# Patient Record
Sex: Male | Born: 1940
Health system: Southern US, Community
[De-identification: ages and names within clinical notes are randomized; demographics above are authoritative.]

## PROBLEM LIST (undated history)

## (undated) DIAGNOSIS — R7303 Prediabetes: Secondary | ICD-10-CM

## (undated) DIAGNOSIS — G2581 Restless legs syndrome: Secondary | ICD-10-CM

## (undated) DIAGNOSIS — E785 Hyperlipidemia, unspecified: Secondary | ICD-10-CM

## (undated) DIAGNOSIS — N529 Male erectile dysfunction, unspecified: Secondary | ICD-10-CM

## (undated) DIAGNOSIS — D72819 Decreased white blood cell count, unspecified: Secondary | ICD-10-CM

## (undated) DIAGNOSIS — M4310 Spondylolisthesis, site unspecified: Secondary | ICD-10-CM

## (undated) DIAGNOSIS — I2510 Atherosclerotic heart disease of native coronary artery without angina pectoris: Secondary | ICD-10-CM

## (undated) DIAGNOSIS — D509 Iron deficiency anemia, unspecified: Secondary | ICD-10-CM

## (undated) DIAGNOSIS — C61 Malignant neoplasm of prostate: Secondary | ICD-10-CM

## (undated) DIAGNOSIS — K219 Gastro-esophageal reflux disease without esophagitis: Secondary | ICD-10-CM

## (undated) DIAGNOSIS — E291 Testicular hypofunction: Secondary | ICD-10-CM

## (undated) DIAGNOSIS — J309 Allergic rhinitis, unspecified: Secondary | ICD-10-CM

## (undated) DIAGNOSIS — K760 Fatty (change of) liver, not elsewhere classified: Secondary | ICD-10-CM

## (undated) DIAGNOSIS — N400 Enlarged prostate without lower urinary tract symptoms: Secondary | ICD-10-CM

## (undated) DIAGNOSIS — I10 Essential (primary) hypertension: Secondary | ICD-10-CM

## (undated) DIAGNOSIS — D751 Secondary polycythemia: Secondary | ICD-10-CM

## (undated) DIAGNOSIS — M1990 Unspecified osteoarthritis, unspecified site: Secondary | ICD-10-CM

## (undated) DIAGNOSIS — R918 Other nonspecific abnormal finding of lung field: Secondary | ICD-10-CM

## (undated) DIAGNOSIS — J439 Emphysema, unspecified: Secondary | ICD-10-CM

## (undated) DIAGNOSIS — Z87442 Personal history of urinary calculi: Secondary | ICD-10-CM

## (undated) DIAGNOSIS — I700 Atherosclerosis of aorta: Secondary | ICD-10-CM

## (undated) DIAGNOSIS — M459 Ankylosing spondylitis of unspecified sites in spine: Secondary | ICD-10-CM

## (undated) HISTORY — DX: Male erectile dysfunction, unspecified: N52.9

## (undated) HISTORY — DX: Gastro-esophageal reflux disease without esophagitis: K21.9

## (undated) HISTORY — DX: Hyperlipidemia, unspecified: E78.5

## (undated) HISTORY — DX: Essential (primary) hypertension: I10

## (undated) HISTORY — PX: ROTATOR CUFF REPAIR: SHX139

---

## 2000-02-16 ENCOUNTER — Emergency Department (HOSPITAL_COMMUNITY): Admission: EM | Admit: 2000-02-16 | Discharge: 2000-02-16 | Payer: Self-pay | Admitting: *Deleted

## 2003-10-17 ENCOUNTER — Ambulatory Visit (HOSPITAL_COMMUNITY): Admission: RE | Admit: 2003-10-17 | Discharge: 2003-10-17 | Payer: Self-pay | Admitting: Internal Medicine

## 2003-12-28 ENCOUNTER — Ambulatory Visit (HOSPITAL_COMMUNITY): Admission: RE | Admit: 2003-12-28 | Discharge: 2003-12-28 | Payer: Self-pay | Admitting: Gastroenterology

## 2003-12-28 ENCOUNTER — Encounter (INDEPENDENT_AMBULATORY_CARE_PROVIDER_SITE_OTHER): Payer: Self-pay | Admitting: *Deleted

## 2004-01-31 ENCOUNTER — Ambulatory Visit (HOSPITAL_COMMUNITY): Admission: RE | Admit: 2004-01-31 | Discharge: 2004-01-31 | Payer: Self-pay | Admitting: Internal Medicine

## 2004-02-17 ENCOUNTER — Ambulatory Visit (HOSPITAL_COMMUNITY): Admission: RE | Admit: 2004-02-17 | Discharge: 2004-02-17 | Payer: Self-pay | Admitting: Internal Medicine

## 2005-03-28 ENCOUNTER — Ambulatory Visit: Payer: Self-pay | Admitting: Family Medicine

## 2005-04-02 ENCOUNTER — Ambulatory Visit: Payer: Self-pay | Admitting: Family Medicine

## 2005-04-25 ENCOUNTER — Ambulatory Visit: Payer: Self-pay | Admitting: Family Medicine

## 2005-04-30 ENCOUNTER — Ambulatory Visit: Payer: Self-pay | Admitting: Family Medicine

## 2005-06-04 ENCOUNTER — Ambulatory Visit: Payer: Self-pay | Admitting: Family Medicine

## 2005-06-18 ENCOUNTER — Ambulatory Visit: Payer: Self-pay | Admitting: Family Medicine

## 2005-07-22 ENCOUNTER — Ambulatory Visit: Payer: Self-pay | Admitting: Family Medicine

## 2006-02-12 ENCOUNTER — Ambulatory Visit: Payer: Self-pay | Admitting: Family Medicine

## 2006-04-01 ENCOUNTER — Encounter: Admission: RE | Admit: 2006-04-01 | Discharge: 2006-04-01 | Payer: Self-pay | Admitting: Family Medicine

## 2006-04-01 ENCOUNTER — Ambulatory Visit: Payer: Self-pay | Admitting: Family Medicine

## 2007-04-10 DIAGNOSIS — J309 Allergic rhinitis, unspecified: Secondary | ICD-10-CM | POA: Insufficient documentation

## 2007-04-10 DIAGNOSIS — K219 Gastro-esophageal reflux disease without esophagitis: Secondary | ICD-10-CM | POA: Insufficient documentation

## 2007-05-05 ENCOUNTER — Ambulatory Visit: Payer: Self-pay | Admitting: Family Medicine

## 2007-05-05 LAB — CONVERTED CEMR LAB
ALT: 15 units/L (ref 0–53)
AST: 17 units/L (ref 0–37)
Bilirubin Urine: NEGATIVE
Bilirubin, Direct: 0.1 mg/dL (ref 0.0–0.3)
Calcium: 9.3 mg/dL (ref 8.4–10.5)
Direct LDL: 137.2 mg/dL
Eosinophils Relative: 3.2 % (ref 0.0–5.0)
GFR calc Af Amer: 109 mL/min
GFR calc non Af Amer: 90 mL/min
Glucose, Urine, Semiquant: NEGATIVE
HCT: 47.9 % (ref 39.0–52.0)
HDL: 40.8 mg/dL (ref >39.0)
Hemoglobin: 16.7 g/dL (ref 13.0–17.0)
Ketones, urine, test strip: NEGATIVE
MCV: 88.3 fL (ref 78.0–100.0)
Monocytes Absolute: 0.4 10*3/uL (ref 0.2–0.7)
Monocytes Relative: 8.8 % (ref 3.0–11.0)
Nitrite: NEGATIVE
Potassium: 5.6 meq/L — ABNORMAL HIGH (ref 3.5–5.1)
Protein, U semiquant: NEGATIVE
RBC: 5.43 M/uL (ref 4.22–5.81)
Sodium: 142 meq/L (ref 135–145)
Total Bilirubin: 0.7 mg/dL (ref 0.3–1.2)
VLDL: 27 mg/dL (ref 0–40)
WBC Urine, dipstick: NEGATIVE
pH: 6.5

## 2007-05-14 ENCOUNTER — Ambulatory Visit: Payer: Self-pay | Admitting: Family Medicine

## 2007-05-14 DIAGNOSIS — M459 Ankylosing spondylitis of unspecified sites in spine: Secondary | ICD-10-CM | POA: Insufficient documentation

## 2007-05-14 DIAGNOSIS — F528 Other sexual dysfunction not due to a substance or known physiological condition: Secondary | ICD-10-CM | POA: Insufficient documentation

## 2007-05-15 ENCOUNTER — Encounter: Admission: RE | Admit: 2007-05-15 | Discharge: 2007-05-15 | Payer: Self-pay | Admitting: Family Medicine

## 2007-05-19 ENCOUNTER — Telehealth: Payer: Self-pay | Admitting: Family Medicine

## 2008-04-28 ENCOUNTER — Encounter: Admission: RE | Admit: 2008-04-28 | Discharge: 2008-04-28 | Payer: Self-pay | Admitting: Orthopedic Surgery

## 2008-05-25 ENCOUNTER — Ambulatory Visit: Payer: Self-pay | Admitting: Family Medicine

## 2008-05-25 LAB — CONVERTED CEMR LAB
Basophils Relative: 0.8 % (ref 0.0–3.0)
Calcium: 9.2 mg/dL (ref 8.4–10.5)
Chloride: 106 meq/L (ref 96–112)
Cholesterol: 238 mg/dL (ref 0–200)
Creatinine, Ser: 0.9 mg/dL (ref 0.4–1.5)
Eosinophils Absolute: 0.2 10*3/uL (ref 0.0–0.7)
GFR calc non Af Amer: 90 mL/min
Glucose, Bld: 97 mg/dL (ref 70–99)
Glucose, Urine, Semiquant: NEGATIVE
HCT: 50.2 % (ref 39.0–52.0)
Hemoglobin: 17.1 g/dL — ABNORMAL HIGH (ref 13.0–17.0)
MCHC: 34 g/dL (ref 30.0–36.0)
Monocytes Relative: 9.1 % (ref 3.0–12.0)
Neutro Abs: 2.9 10*3/uL (ref 1.4–7.7)
RDW: 13.4 % (ref 11.5–14.6)
Sodium: 142 meq/L (ref 135–145)
Specific Gravity, Urine: 1.02
Total Bilirubin: 1 mg/dL (ref 0.3–1.2)
Total CHOL/HDL Ratio: 5
Total Protein: 7 g/dL (ref 6.0–8.3)
Urobilinogen, UA: 0.2
VLDL: 39 mg/dL (ref 0–40)
WBC Urine, dipstick: NEGATIVE
WBC: 5 10*3/uL (ref 4.5–10.5)

## 2008-06-09 ENCOUNTER — Ambulatory Visit: Payer: Self-pay | Admitting: Family Medicine

## 2008-06-09 DIAGNOSIS — I10 Essential (primary) hypertension: Secondary | ICD-10-CM | POA: Insufficient documentation

## 2008-06-09 DIAGNOSIS — M1990 Unspecified osteoarthritis, unspecified site: Secondary | ICD-10-CM | POA: Insufficient documentation

## 2008-08-12 ENCOUNTER — Telehealth: Payer: Self-pay | Admitting: Family Medicine

## 2008-08-17 ENCOUNTER — Telehealth: Payer: Self-pay | Admitting: *Deleted

## 2009-02-08 ENCOUNTER — Telehealth: Payer: Self-pay | Admitting: Family Medicine

## 2009-03-27 ENCOUNTER — Ambulatory Visit: Payer: Self-pay | Admitting: Family Medicine

## 2009-03-27 LAB — CONVERTED CEMR LAB
AST: 27 units/L (ref 0–37)
BUN: 15 mg/dL (ref 6–23)
Bilirubin, Direct: 0.1 mg/dL (ref 0.0–0.3)
Calcium: 9.2 mg/dL (ref 8.4–10.5)
Chloride: 108 meq/L (ref 96–112)
Creatinine, Ser: 1 mg/dL (ref 0.4–1.5)
Eosinophils Relative: 2 % (ref 0.0–5.0)
Glucose, Bld: 100 mg/dL — ABNORMAL HIGH (ref 70–99)
HCT: 45.7 % (ref 39.0–52.0)
HDL: 42.7 mg/dL (ref >39.00)
LDL Cholesterol: 113 mg/dL — ABNORMAL HIGH (ref 0–99)
MCHC: 33.4 g/dL (ref 30.0–36.0)
Monocytes Relative: 6.6 % (ref 3.0–12.0)
Neutro Abs: 2.7 10*3/uL (ref 1.4–7.7)
Neutrophils Relative %: 65.9 % (ref 43.0–77.0)
PSA: 1.92 ng/mL (ref 0.10–4.00)
Total CHOL/HDL Ratio: 4
VLDL: 16.6 mg/dL (ref 0.0–40.0)
WBC: 4.1 10*3/uL — ABNORMAL LOW (ref 4.5–10.5)

## 2009-04-04 ENCOUNTER — Ambulatory Visit: Payer: Self-pay | Admitting: Family Medicine

## 2009-05-22 ENCOUNTER — Telehealth: Payer: Self-pay | Admitting: Family Medicine

## 2009-11-08 ENCOUNTER — Telehealth: Payer: Self-pay | Admitting: Family Medicine

## 2010-03-05 ENCOUNTER — Ambulatory Visit: Payer: Self-pay | Admitting: Family Medicine

## 2010-03-05 LAB — CONVERTED CEMR LAB
ALT: 18 units/L (ref 0–53)
Basophils Relative: 0.6 % (ref 0.0–3.0)
Bilirubin, Direct: 0.1 mg/dL (ref 0.0–0.3)
Blood in Urine, dipstick: NEGATIVE
CO2: 28 meq/L (ref 19–32)
Chloride: 104 meq/L (ref 96–112)
Cholesterol: 218 mg/dL — ABNORMAL HIGH (ref 0–200)
Direct LDL: 157.4 mg/dL
Eosinophils Absolute: 0.1 10*3/uL (ref 0.0–0.7)
Glucose, Bld: 133 mg/dL — ABNORMAL HIGH (ref 70–99)
Hemoglobin: 15.8 g/dL (ref 13.0–17.0)
MCHC: 33.4 g/dL (ref 30.0–36.0)
MCV: 87.2 fL (ref 78.0–100.0)
Monocytes Absolute: 0.5 10*3/uL (ref 0.1–1.0)
Nitrite: NEGATIVE
PSA: 2.16 ng/mL (ref 0.10–4.00)
Platelets: 166 10*3/uL (ref 150.0–400.0)
Protein, U semiquant: NEGATIVE
RDW: 13.7 % (ref 11.5–14.6)
Specific Gravity, Urine: 1.025
TSH: 1 microintl units/mL (ref 0.35–5.50)
Triglycerides: 162 mg/dL — ABNORMAL HIGH (ref 0.0–149.0)
VLDL: 32.4 mg/dL (ref 0.0–40.0)
WBC: 5.6 10*3/uL (ref 4.5–10.5)
pH: 6

## 2010-03-13 ENCOUNTER — Ambulatory Visit: Payer: Self-pay | Admitting: Family Medicine

## 2010-03-15 ENCOUNTER — Telehealth: Payer: Self-pay | Admitting: Family Medicine

## 2010-03-16 ENCOUNTER — Telehealth: Payer: Self-pay | Admitting: Family Medicine

## 2010-04-03 ENCOUNTER — Ambulatory Visit: Payer: Self-pay | Admitting: Family Medicine

## 2010-06-06 ENCOUNTER — Telehealth: Payer: Self-pay | Admitting: Family Medicine

## 2010-07-29 ENCOUNTER — Encounter: Payer: Self-pay | Admitting: Internal Medicine

## 2010-07-30 ENCOUNTER — Encounter: Payer: Self-pay | Admitting: Orthopedic Surgery

## 2010-08-07 NOTE — Assessment & Plan Note (Signed)
Summary: CPX // RS   Vital Signs:  Patient profile:   70 year old male Height:      70.5 inches Weight:      226 pounds BMI:     32.08 Temp:     98.1 degrees F oral BP sitting:   130 / 90  (left arm) Cuff size:   regular  Vitals Entered By: Kathrynn Speed CMA (March 13, 2010 1:56 PM) CC: cpx, review labs, src Is Patient Diabetic? No   CC:  cpx, review labs, and src.  History of Present Illness: Terry Rogers is a 70 year old, married male, retired ex-smoker, who comes in today for evaluation of osteoarthritis, hypertension, erectile dysfunction, reflux esophagitis, allergic rhinitis hyperlipidemia.  For his osteoarthritis.  He takes indomethacin 75 mg b.i.d..  For his hypertension.  He takes atenolol 25 mg daily.  BP 130/90.  He takes omeprazole 20 mg daily for reflux esophagitis.  Viagra 100 mg p.r.n. for ED, Nasacort, and Astelin nasal spray for allergic rhinitis.  His insurance will no longer cover Astelin.  Advised to switch to Zyrtec 10 mg nightly.  He also takes Zetia 10 mg daily for hyperlipidemia.  Being an ex-smoker.  He concerned about lung cancer.  We discussed the fact that there are no really good screening test.  I offered experimental protocol with screening CT scan versus routine chest x-ray.  Patient elects for screening.  Chest x-ray.  Tetanus 2006, Pneumovax 2008, seasonal flu today.  Information given on shingles Flu Vaccine Consent Questions     Do you have a history of severe allergic reactions to this vaccine? no    Any prior history of allergic reactions to egg and/or gelatin? no    Do you have a sensitivity to the preservative Thimersol? no    Do you have a past history of Guillan-Barre Syndrome? no    Do you currently have an acute febrile illness? no    Have you ever had a severe reaction to latex? no    Vaccine information given and explained to patient? yes    Are you currently pregnant? no    Lot Number:[id number]   Exp Date:01/05/2011   Site Given   Left Deltoid IM   Preventive Screening-Counseling & Management  Alcohol-Tobacco     Smoking Status: quit     Year Quit: 07     Passive Smoke Exposure: no  Current Medications (verified): 1)  Indocin Sr 75 Mg  Cpcr (Indomethacin) .... Two Times A Day 2)  Atenolol 25 Mg  Tabs (Atenolol) .... Once Daily 3)  Omeprazole 20 Mg  Cpdr (Omeprazole) .... Once Daily 4)  Viagra 100 Mg  Tabs (Sildenafil Citrate) .... As Needed 5)  Nasacort Aq 55 Mcg/act  Aers (Triamcinolone Acetonide(Nasal)) .... As Needed 6)  Astelin 137 Mcg/spray  Soln (Azelastine Hcl) .... Once Daily 7)  Zetia 10 Mg Tabs (Ezetimibe) .... Take One Tab By Mouth Once Daily  Allergies (verified): 1)  ! Sulfa  Past History:  Past medical, surgical, family and social histories (including risk factors) reviewed, and no changes noted (except as noted below).  Past Medical History: Reviewed history from 06/09/2008 and no changes required. ED Allergic rhinitis GERD Hypertension Osteoarthritis  Past Surgical History: Reviewed history from 06/09/2008 and no changes required. Colonoscopy Tonsillectomy Rotator cuff repair right and left shoulders 09  Family History: Reviewed history from 04/10/2007 and no changes required. Family History Breast cancer 1st degree relative <50 Family History of Neurological disorder  Social History: Reviewed  history from 06/09/2008 and no changes required. Occupation: Married  Alcohol use-yes Former Smoker Alcohol use-no Drug use-no  Review of Systems      See HPI  Physical Exam  General:  Well-developed,well-nourished,in no acute distress; alert,appropriate and cooperative throughout examination Head:  Normocephalic and atraumatic without obvious abnormalities. No apparent alopecia or balding. Eyes:  No corneal or conjunctival inflammation noted. EOMI. Perrla. Funduscopic exam benign, without hemorrhages, exudates or papilledema. Vision grossly normal. Ears:  External ear exam  shows no significant lesions or deformities.  Otoscopic examination reveals clear canals, tympanic membranes are intact bilaterally without bulging, retraction, inflammation or discharge. Hearing is grossly normal bilaterally. Nose:  External nasal examination shows no deformity or inflammation. Nasal mucosa are pink and moist without lesions or exudates. Mouth:  Oral mucosa and oropharynx without lesions or exudates.  Teeth in good repair. Neck:  No deformities, masses, or tenderness noted. Chest Wall:  No deformities, masses, tenderness or gynecomastia noted. Breasts:  No masses or gynecomastia noted Lungs:  Normal respiratory effort, chest expands symmetrically. Lungs are clear to auscultation, no crackles or wheezes. Heart:  Normal rate and regular rhythm. S1 and S2 normal without gallop, murmur, click, rub or other extra sounds. Abdomen:  Bowel sounds positive,abdomen soft and non-tender without masses, organomegaly or hernias noted. Rectal:  No external abnormalities noted. Normal sphincter tone. No rectal masses or tenderness. Genitalia:  Testes bilaterally descended without nodularity, tenderness or masses. No scrotal masses or lesions. No penis lesions or urethral discharge. Prostate:  Prostate gland firm and smooth, no enlargement, nodularity, tenderness, mass, asymmetry or induration. Msk:  No deformity or scoliosis noted of thoracic or lumbar spine.   Pulses:  R and L carotid,radial,femoral,dorsalis pedis and posterior tibial pulses are full and equal bilaterally Extremities:  No clubbing, cyanosis, edema, or deformity noted with normal full range of motion of all joints.   Neurologic:  No cranial nerve deficits noted. Station and gait are normal. Plantar reflexes are down-going bilaterally. DTRs are symmetrical throughout. Sensory, motor and coordinative functions appear intact. Skin:  Intact without suspicious lesions or rashes Cervical Nodes:  No lymphadenopathy noted Axillary Nodes:   No palpable lymphadenopathy Inguinal Nodes:  No significant adenopathy Psych:  Cognition and judgment appear intact. Alert and cooperative with normal attention span and concentration. No apparent delusions, illusions, hallucinations   Impression & Recommendations:  Problem # 1:  OSTEOARTHRITIS (ICD-715.90) Assessment Unchanged  His updated medication list for this problem includes:    Indocin Sr 75 Mg Cpcr (Indomethacin) .Marland Kitchen..Marland Kitchen Two times a day  Orders: Prescription Created Electronically [phone])  Problem # 2:  HYPERTENSION (ICD-401.9) Assessment: Unchanged  His updated medication list for this problem includes:    Atenolol 25 Mg Tabs (Atenolol) ..... Once daily  Orders: EKG w/ Interpretation (93000) T-2 View CXR (71020TC) Prescription Created Electronically [phone])  Problem # 3:  ERECTILE DYSFUNCTION, MILD (ICD-302.72) Assessment: Unchanged  His updated medication list for this problem includes:    Viagra 100 Mg Tabs (Sildenafil citrate) .Marland Kitchen... As needed  Orders: Prescription Created Electronically [phone])  Problem # 4:  PHYSICAL EXAMINATION (ICD-V70.0) Assessment: Unchanged  Orders: EKG w/ Interpretation (93000) Prescription Created Electronically [phone])  Problem # 5:  GERD (ICD-530.81) Assessment: Improved  His updated medication list for this problem includes:    Omeprazole 20 Mg Cpdr (Omeprazole) ..... Once daily  Orders: Prescription Created Electronically [id number])  Problem # 6:  ALLERGIC RHINITIS (ICD-477.9) Assessment: Improved  The following medications were removed from the medication list:  Astelin 137 Mcg/spray Soln (Azelastine hcl) ..... Once daily His updated medication list for this problem includes:    Nasacort Aq 55 Mcg/act Aers (Triamcinolone acetonide(nasal)) .Marland Kitchen... As needed  Orders: Prescription Created Electronically [id number])  Complete Medication List: 1)  Indocin Sr 75 Mg Cpcr (Indomethacin) .... Two times a day 2)  Atenolol 25 Mg  Tabs (Atenolol) .... Once daily 3)  Omeprazole 20 Mg Cpdr (Omeprazole) .... Once daily 4)  Viagra 100 Mg Tabs (Sildenafil citrate) .... As needed 5)  Nasacort Aq 55 Mcg/act Aers (Triamcinolone acetonide(nasal)) .... As needed 6)  Zetia 10 Mg Tabs (Ezetimibe) .... Take one tab by mouth once daily  Other Orders: Admin 1st Vaccine (60454) Flu Vaccine 26yrs + [phone])  Patient Instructions: 1)  continue the steroid nasal spray, however, I would add 10 mg a plain Zyrtec at bedtime in lieu of the antihistamine  nasal spray 2)  We will call you or the report on your chest x-ray. 3)  Consider the information on the shingles.  Vaccine 4)  Please schedule a follow-up appointment in 1 year. 5)  Please schedule a follow-up appointment as needed. Prescriptions: ZETIA 10 MG TABS (EZETIMIBE) take one tab by mouth once daily  #100 x 3   Entered and Authorized by:   Roderick Pee MD   Signed by:   Roderick Pee MD on 03/13/2010   Method used:   Electronically to        [facility]* (retail)             ,          Ph: [phone]       Fax: [phone]   RxID:   [id number] NASACORT AQ 55 MCG/ACT  AERS (TRIAMCINOLONE ACETONIDE(NASAL)) as needed  #3 x 3   Entered and Authorized by:   Roderick Pee MD   Signed by:   Roderick Pee MD on 03/13/2010   Method used:   Electronically to        [facility]* (retail)             ,          Ph: [phone]       Fax: [phone]   RxID:   [id number] VIAGRA 100 MG  TABS (SILDENAFIL CITRATE) as needed  #6 x 11   Entered and Authorized by:   Roderick Pee MD   Signed by:   Roderick Pee MD on 03/13/2010   Method used:   Electronically to        [facility]* (retail)             ,          Ph: [phone]       Fax: [phone]   RxID:   [id number] OMEPRAZOLE 20 MG  CPDR (OMEPRAZOLE) once daily  #100 Capsule x 3   Entered and Authorized by:   Roderick Pee MD   Signed by:   Roderick Pee MD on 03/13/2010   Method  used:   Electronically to        [facility]* (retail)             ,          Ph: [phone]       Fax: [phone]   RxID:   [id number] ATENOLOL 25 MG  TABS (ATENOLOL) once daily  #100 x 3   Entered and Authorized by:   Roderick Pee  MD   Signed by:   Roderick Pee MD on 03/13/2010   Method used:   Electronically to        [facility]* (retail)             ,          Ph: [phone]       Fax: [phone]   RxID:   [id number] INDOCIN SR 75 MG  CPCR (INDOMETHACIN) two times a day  #200 x 3   Entered and Authorized by:   Roderick Pee MD   Signed by:   Roderick Pee MD on 03/13/2010   Method used:   Electronically to        [facility]* (retail)             ,          Ph: [phone]       Fax: [phone]   RxID:   [id number]

## 2010-08-07 NOTE — Progress Notes (Signed)
Summary: take out wife's # & NS to [facility]  Phone Note Call from Patient Call back at Home Phone [phone]   Caller: vm Call For: rachel Summary of Call: 1)Take wife's number, [id number]. out of my records. 2) NS message was called to wife.  She didn't completely get it.  Want you to call it to [facility].  Please let me know.  Not sure if you've done that.   Initial call taken by: Rudy Jew, RN,  March 16, 2010 11:09 AM  Follow-up for Phone Call        spoke with patient  Follow-up by: Kern Reap CMA Duncan Dull),  March 16, 2010 11:26 AM

## 2010-08-07 NOTE — Progress Notes (Signed)
Summary: please return call  Phone Note Call from Patient Call back at Home Phone [phone]   Caller: Patient--live call Summary of Call: received letter from [facility]. they want him to have a pneumo vax .is this ok for him to get? Initial call taken by: Warnell Forester,  June 06, 2010 11:02 AM  Follow-up for Phone Call        Phone Call Completed Follow-up by: Kern Reap CMA Duncan Dull),  June 06, 2010 1:07 PM

## 2010-08-07 NOTE — Assessment & Plan Note (Signed)
Summary: SHINGLES INJ/NJR  Nurse Visit   Allergies: 1)  ! Sulfa  Immunizations Administered:  Zostavax # 1:    Vaccine Type: Zostavax    Site: left deltoid    Mfr: Merck    Dose: 0.65    Route: [place]    Given by: Kern Reap CMA (AAMA)    Exp. Date: 02/23/2011    Lot #: [id number]    VIS given: 04/19/05 given April 03, 2010.    Physician counseled: yes  Orders Added: 1)  Zoster (Shingles) Vaccine Live [90736] 2)  Admin 1st Vaccine [phone]

## 2010-08-07 NOTE — Progress Notes (Signed)
Summary: refills  Phone Note Refill Request Message from:  Fax from Pharmacy on Nov 08, 2009 1:34 PM  Refills Requested: Medication #1:  INDOCIN SR 75 MG  CPCR two times a day  Medication #2:  OMEPRAZOLE 20 MG  CPDR once daily Initial call taken by: Kern Reap CMA Duncan Dull),  Nov 08, 2009 1:34 PM    Prescriptions: OMEPRAZOLE 20 MG  CPDR (OMEPRAZOLE) once daily  #100 x 0   Entered by:   Kern Reap CMA (AAMA)   Authorized by:   Roderick Pee MD   Signed by:   Kern Reap CMA (AAMA) on 11/08/2009   Method used:   Electronically to        [facility]* (mail-order)             ,          Ph: [phone]       Fax: [phone]   RxID:   [id number] INDOCIN SR 75 MG  CPCR (INDOMETHACIN) two times a day  #200 x 0   Entered by:   Kern Reap CMA (AAMA)   Authorized by:   Roderick Pee MD   Signed by:   Kern Reap CMA (AAMA) on 11/08/2009   Method used:   Electronically to        [facility]* (mail-order)             ,          Ph: [phone]       Fax: [phone]   RxID:   [id number]

## 2010-08-07 NOTE — Progress Notes (Signed)
Summary: rx  Phone Note Call from Patient Call back at Work Phone [phone]   Summary of Call: patient is calling because nasocort is no longer available and would like the rx switched. Initial call taken by: Kern Reap CMA Duncan Dull),  March 15, 2010 1:53 PM    New/Updated Medications: FLONASE 50 MCG/ACT SUSP (FLUTICASONE PROPIONATE) use as directed Prescriptions: FLONASE 50 MCG/ACT SUSP (FLUTICASONE PROPIONATE) use as directed  #3 x 4   Entered by:   Kern Reap CMA (AAMA)   Authorized by:   Roderick Pee MD   Signed by:   Kern Reap CMA (AAMA) on 03/15/2010   Method used:   Electronically to        [facility]* (retail)             ,          Ph: [phone]       Fax: [phone]   RxID:   [id number]

## 2010-08-21 ENCOUNTER — Ambulatory Visit (INDEPENDENT_AMBULATORY_CARE_PROVIDER_SITE_OTHER): Payer: Medicare Other | Admitting: Family Medicine

## 2010-08-21 ENCOUNTER — Encounter: Payer: Self-pay | Admitting: Family Medicine

## 2010-08-21 VITALS — BP 160/100 | Temp 98.4°F | Ht 71.5 in | Wt 238.0 lb

## 2010-08-21 DIAGNOSIS — R197 Diarrhea, unspecified: Secondary | ICD-10-CM

## 2010-08-21 DIAGNOSIS — F528 Other sexual dysfunction not due to a substance or known physiological condition: Secondary | ICD-10-CM

## 2010-08-21 DIAGNOSIS — I10 Essential (primary) hypertension: Secondary | ICD-10-CM

## 2010-08-21 MED ORDER — SILDENAFIL CITRATE 100 MG PO TABS: 100.0000 mg | ORAL_TABLET | ORAL | Status: DC | PRN

## 2010-08-21 NOTE — Patient Instructions (Signed)
Ago on a complete to lactose free diet for one week if after that point in time is symptoms do not abate and call GI [phone] and make an appointment to see a gastroenterologist.  Check blood pressure daily for two weeks.  If your blood pressure drops to normal,,,,,,,,,, 135/85 or less,,,,,,,,,, then continue your current dose of atenolol.  If not been increased to 50 mg.  Return for y  annual physical exam

## 2010-08-21 NOTE — Progress Notes (Signed)
  Subjective:    Patient ID: Terry Rogers, male    DOB: [birth year], 70 y.o.   MRN: [id number]  HPI Terry Rogers is a 70 year old male, who comes in today for evaluation of diarrhea x 8 weeks.  He states around Christmas time.  He was in [place] and developed diarrhea.  He had a evaluation, including a stool culture, all of which was negative.  However, the diarrhea persists.  He describes it as 3 loose bowel movements a day.  No fever, chills, nausea, vomiting, etc. No weight loss.  No blood.  Recent colonoscopy normal.  Also, his blood pressure is 160/100 on Tenormin 25 daily.  He would also like a refill of 100,,,,,,,,,,,, 100-mg Viagra tablets.  He gets in [place]   Review of Systems    Negative Objective:   Physical Exam    Well-developed well-nourished, male in no acute distress.  Examination of the abdomen was negative    Assessment & Plan:  Loose bowel movements typically not diarrhea at 3 loose bowel movements per day.  Recommend a lactose free diet.  If symptoms persist.  GI consult.  Hypertension.  Question control.  Check your blood pressure daily if it continues to be elevated and then would need to increase her beta-blocker to 50 mg daily.  Erectile dysfunction.  I explained I would be happy to write in 6 tablets with 11 refills, not 100

## 2010-09-07 ENCOUNTER — Telehealth: Payer: Self-pay | Admitting: Family Medicine

## 2010-09-07 DIAGNOSIS — I10 Essential (primary) hypertension: Secondary | ICD-10-CM

## 2010-09-07 MED ORDER — ATENOLOL 50 MG PO TABS: 50.0000 mg | ORAL_TABLET | Freq: Every day | ORAL | Status: DC

## 2010-09-07 NOTE — Telephone Encounter (Signed)
Pt has been ckecking and recording bp reading for 2 wks as requested  By Dr. Tawanna Cooler.  It has been averaging 143/90, does he want to change my medication?

## 2010-09-07 NOTE — Telephone Encounter (Signed)
Increase the beta-blocker to 50 mg daily in the morning.  Check y  blood pressure daily for 4 weeks goal 135/85 or less

## 2010-09-07 NOTE — Telephone Encounter (Signed)
rx sent and patient is aware 

## 2010-09-17 ENCOUNTER — Encounter (INDEPENDENT_AMBULATORY_CARE_PROVIDER_SITE_OTHER): Payer: Self-pay | Admitting: *Deleted

## 2010-09-21 ENCOUNTER — Telehealth: Payer: Self-pay | Admitting: *Deleted

## 2010-09-21 NOTE — Telephone Encounter (Signed)
patient  Is calling because his blood pressure readings are still ranging 130/80.  He is taking atenolol 50 every day.  Any suggestions?  He also says that his blood pressure is running lower in the afternoon.

## 2010-09-24 NOTE — Telephone Encounter (Signed)
Left message on machine for patient

## 2010-09-24 NOTE — Telephone Encounter (Signed)
Fleet Contras please call 130/80, is normal

## 2010-09-25 NOTE — Letter (Signed)
Summary: New Patient letter  [facility]  [address] [place], [place] [address]   Phone: [phone]  Fax: [phone]       09/17/2010 MRN: [id number]  Terry Rogers [address] [place], [place]  [address]  [place]  Dear Mr. Spurlock,  Welcome to the Gastroenterology Division at [facility].    You are scheduled to see Dr.  Leone Payor on 10-02-10 at 1:45P.M. on the 3rd floor at [facility], [address].  We ask that you try to arrive at our office 15 minutes prior to your appointment time to allow for check-in.  We would like you to complete the enclosed self-administered evaluation form prior to your visit and bring it with you on the day of your appointment.  We will review it with you.  Also, please bring a complete list of all your medications or, if you prefer, bring the medication bottles and we will list them.  Please bring your insurance card so that we may make a copy of it.  If your insurance requires a referral to see a specialist, please bring your referral form from your primary care physician.  Co-payments are due at the time of your visit and may be paid by cash, check or credit card.     Your office visit will consist of a consult with your physician (includes a physical exam), any laboratory testing he/she may order, scheduling of any necessary diagnostic testing (e.g. x-ray, ultrasound, CT-scan), and scheduling of a procedure (e.g. Endoscopy, Colonoscopy) if required.  Please allow enough time on your schedule to allow for any/all of these possibilities.    If you cannot keep your appointment, please call [phone] to cancel or reschedule prior to your appointment date.  This allows [place] the opportunity to schedule an appointment for another patient in need of care.  If you do not cancel or reschedule by 5 p.m. the business day prior to your appointment date, you will be charged a $50.00 late cancellation/no-show fee.    Thank you for  choosing [facility] for your medical needs.  We appreciate the opportunity to care for you.  Please visit [place] at our website  to learn more about our practice.                     Sincerely,                                                             The Gastroenterology Division

## 2010-10-02 ENCOUNTER — Ambulatory Visit (INDEPENDENT_AMBULATORY_CARE_PROVIDER_SITE_OTHER): Payer: Medicare Other | Admitting: Internal Medicine

## 2010-10-02 ENCOUNTER — Encounter: Payer: Self-pay | Admitting: Internal Medicine

## 2010-10-02 VITALS — BP 122/84 | HR 80 | Ht 71.0 in | Wt 234.0 lb

## 2010-10-02 DIAGNOSIS — R197 Diarrhea, unspecified: Secondary | ICD-10-CM | POA: Insufficient documentation

## 2010-10-02 MED ORDER — PEG-KCL-NACL-NASULF-NA ASC-C 100 G PO SOLR: 1.0000 | Freq: Once | ORAL | Status: AC

## 2010-10-02 MED ORDER — ALIGN 4 MG PO CAPS: 1.0000 | ORAL_CAPSULE | Freq: Every day | ORAL | Status: AC

## 2010-10-02 NOTE — Assessment & Plan Note (Addendum)
Chronic problem. Started with an acute illness while in [place]. Improved with antibiotics and then relapsed. I can find no risk factors for this at this point though he does have ankylosing spondylitis so that may increase his risk for inflammatory bowel disease. Will plan on a colonoscopy with random biopsies at least and hopeful terminal ileal intubation. Further plans pending the colonoscopy results. In the interim will try Align probiotic. He could have a post-infectious IBS. Risks and benefits of procedure explained. Routine CPX labs in Nov 2011 all normal and reviewed.

## 2010-10-02 NOTE — Progress Notes (Signed)
  Subjective:    Patient ID: Terry Rogers, male    DOB: [birth year], 70 y.o.   MRN: [id number]  HPI Comments: Bad stomach whole life. Wears suspenders to avoid nausea from belt pressure. In December in [place] he had 3 weeks of diarrhea. Saw an MD and stool tests (negative) and received an antibiotic he cannot remember name. Bad diarrhea improved but had persistent loose and frequent stools. It then improved after stopping Imodium. Then in last few weeks he has had "bad diarrhea"  For about a week. No incontinence. No abdominal pain. No fever or chills. No antibiotics prior to December. In general he will get loose stools with excessive alcohol, fatty foods may give trouble also. No unintentional weight loss. No recent medication changes.  Diarrhea  Pertinent negatives include no arthralgias or headaches.      Review of Systems  Constitutional: Negative.   HENT: Negative.   Eyes: Negative.   Respiratory: Negative.   Cardiovascular: Negative.   Gastrointestinal: Positive for diarrhea.  Genitourinary: Negative for dysuria, frequency, flank pain and difficulty urinating.  Musculoskeletal: Negative for back pain, joint swelling and arthralgias.  Neurological: Negative for dizziness, weakness and headaches.  Hematological: Negative for adenopathy.  Psychiatric/Behavioral: Negative for behavioral problems, dysphoric mood and agitation.       Objective:   Physical Exam  Constitutional: He is oriented to person, place, and time. He appears well-developed and well-nourished.  Eyes: Conjunctivae are normal. Pupils are equal, round, and reactive to light.  Neck: Normal range of motion. Neck supple. No thyromegaly present.  Cardiovascular: Normal rate, regular rhythm and normal heart sounds.   No murmur heard. Pulmonary/Chest: Effort normal and breath sounds normal. No respiratory distress.  Abdominal: Soft. Bowel sounds are normal. He exhibits no distension and no mass. There is no  tenderness.  Musculoskeletal: He exhibits no edema.  Lymphadenopathy:    He has no cervical adenopathy.  Neurological: He is oriented to person, place, and time.  Skin: Skin is warm and dry.  Psychiatric: He has a normal mood and affect.           Assessment & Plan:

## 2010-10-02 NOTE — Patient Instructions (Addendum)
Start the Align capsules to see if that helps her diarrhea. We will see you at your colonoscopy as scheduled. If your diarrhea completely resolves on the overlying, call back you may not need the colonoscopy. Colonoscopy has been scheduled for 10/30/10 3:30 pm arrive at 2:30 pm. Your prescription has been sent to your pharmacy for you to pick up.

## 2010-10-08 ENCOUNTER — Other Ambulatory Visit: Payer: Medicare Other | Admitting: Internal Medicine

## 2010-10-29 ENCOUNTER — Encounter: Payer: Self-pay | Admitting: Internal Medicine

## 2010-10-30 ENCOUNTER — Ambulatory Visit (AMBULATORY_SURGERY_CENTER): Payer: Medicare Other | Admitting: Internal Medicine

## 2010-10-30 ENCOUNTER — Encounter: Payer: Self-pay | Admitting: Internal Medicine

## 2010-10-30 VITALS — BP 127/87 | HR 65 | Temp 97.7°F | Resp 20 | Ht 71.0 in | Wt 220.0 lb

## 2010-10-30 DIAGNOSIS — D126 Benign neoplasm of colon, unspecified: Secondary | ICD-10-CM

## 2010-10-30 DIAGNOSIS — R197 Diarrhea, unspecified: Secondary | ICD-10-CM

## 2010-10-30 DIAGNOSIS — K633 Ulcer of intestine: Secondary | ICD-10-CM

## 2010-10-30 DIAGNOSIS — K5190 Ulcerative colitis, unspecified, without complications: Secondary | ICD-10-CM

## 2010-10-30 MED ORDER — SODIUM CHLORIDE 0.9 % IV SOLN: 500.0000 mL | INTRAVENOUS | Status: DC

## 2010-10-30 NOTE — Patient Instructions (Signed)
Our office will call you with pathology results and instructions on follow up care.

## 2010-10-31 ENCOUNTER — Telehealth: Payer: Self-pay

## 2010-10-31 NOTE — Telephone Encounter (Signed)

## 2010-11-07 NOTE — Progress Notes (Signed)
Quick Note:  Call from office: 1) polyp not precancerous 2) ileal ulcers are non-specific inflammation  He was better with align. How is he now re: diarrhea?  [facility] Needs 10 year colonoscopy recall ______

## 2010-11-08 ENCOUNTER — Telehealth: Payer: Self-pay

## 2010-11-08 NOTE — Telephone Encounter (Signed)
Patient reports that he has no diarrhea as long as he is taking Librarian, academic.  He is doing much better.  He will call back for further problems or questions

## 2010-11-08 NOTE — Telephone Encounter (Signed)
Message copied by Darcey Nora on Thu Nov 08, 2010 12:00 PM ------      Message from: Stan Head      Created: Wed Nov 07, 2010 11:23 PM       Call from office:      1) polyp not precancerous      2) ileal ulcers are non-specific inflammation            He was better with align. How is he now re: diarrhea?            [facility]      Needs 10 year colonoscopy recall

## 2010-11-23 NOTE — Op Note (Signed)
Terry Rogers, Terry Rogers                            ACCOUNT NO.:  [id number]   MEDICAL RECORD NO.:  [id number]                   PATIENT TYPE:  AMB   LOCATION:  ENDO                                 FACILITY:  [facility]   PHYSICIAN:  Danise Edge, M.D.                DATE OF BIRTH:  1940-10-20   DATE OF PROCEDURE:  12/28/2003  DATE OF DISCHARGE:                                 OPERATIVE REPORT   PROCEDURE:  Colonoscopy and polypectomy.   PROCEDURE INDICATION:  [name] is a 70 year old male, born 19-Jul-1940.  [name] is scheduled to undergo his first screening  colonoscopy with polypectomy to prevent colon cancer.   ENDOSCOPIST:  Charolett Bumpers, M.D.   PREMEDICATION:  1. Versed 7 mg.  2. Demerol 50 mg.   DESCRIPTION OF PROCEDURE:  After obtaining informed consent, [name] was  placed in the left lateral decubitus position.  I administered intravenous  Demerol and intravenous Versed to achieve conscious sedation for the  procedure.  The patient's blood pressure, oxygen saturation, and cardiac  rhythm were monitored throughout the procedure and documented in the medical  record.   Anal inspection and digital rectal exam were normal.  The Olympus adjustable  pediatric colonoscope was introduced into the rectum and advanced to the  cecum.  Colonic preparation for the exam today was excellent.   RECTUM:  Four 2 mm sessile polyps were removed from the rectum with the hot  biopsy forceps.  SIGMOID COLON AND DESCENDING COLON:  From the sigmoid colon, at 30 cm from  the anal verge, a 2 mm sessile polyp was removed with the hot biopsy  forceps.  At 70 cm from the anal verge, a 2 mm sessile polyp was removed  with the hot biopsy forceps.  SPLENIC FLEXURE:  Normal.  TRANSVERSE COLON:  Normal.  HEPATIC FLEXURE:  Normal.  ASCENDING COLON:  Normal.  CECUM AND ILEOCECAL VALVE:  Normal.   ASSESSMENT:  A small polyp was removed from the descending colon.  A small  polyp was  removed from the sigmoid colon, and 4 small polyps were removed  from the rectum.  All polyps were removed with the hot biopsy forceps and  submitted in 1 bottle for pathological evaluation.                                               Danise Edge, M.D.    MJ/MEDQ  D:  12/28/2003  T:  12/28/2003  Job:  [id number]   cc:   Ike Bene, M.D.  [address]  [place]  [place] [address]  Fax: [phone]

## 2010-12-14 ENCOUNTER — Telehealth: Payer: Self-pay | Admitting: *Deleted

## 2010-12-14 NOTE — Telephone Encounter (Signed)
patient  States that the shingles vaccine given in the office will cost $285, but given at the pharmacy would have been no charge according to his insurance. fyi.

## 2010-12-25 ENCOUNTER — Telehealth: Payer: Self-pay | Admitting: *Deleted

## 2010-12-25 NOTE — Telephone Encounter (Signed)
Pt has poison ivy -request steroids--[address]- pt number is [phone]

## 2010-12-26 MED ORDER — PREDNISONE 20 MG PO TABS: 20.0000 mg | ORAL_TABLET | Freq: Every day | ORAL | Status: AC

## 2010-12-26 NOTE — Telephone Encounter (Signed)
Prednisone 20 mg, dispense 30 tabs directions two tabs x 3 days, one x 3 days, a half x 3 days, then half a tablet Monday, Wednesday, Friday, for a two week taper, refills x 1 

## 2011-02-02 ENCOUNTER — Other Ambulatory Visit: Payer: Self-pay | Admitting: Family Medicine

## 2011-04-19 ENCOUNTER — Other Ambulatory Visit: Payer: Self-pay | Admitting: Family Medicine

## 2011-04-24 ENCOUNTER — Other Ambulatory Visit: Payer: Self-pay | Admitting: *Deleted

## 2011-04-24 MED ORDER — EZETIMIBE 10 MG PO TABS: 10.0000 mg | ORAL_TABLET | Freq: Every day | ORAL | Status: DC

## 2011-04-25 ENCOUNTER — Other Ambulatory Visit (INDEPENDENT_AMBULATORY_CARE_PROVIDER_SITE_OTHER): Payer: Medicare Other

## 2011-04-25 DIAGNOSIS — Z125 Encounter for screening for malignant neoplasm of prostate: Secondary | ICD-10-CM

## 2011-04-25 DIAGNOSIS — I10 Essential (primary) hypertension: Secondary | ICD-10-CM

## 2011-04-25 DIAGNOSIS — Z0000 Encounter for general adult medical examination without abnormal findings: Secondary | ICD-10-CM

## 2011-04-25 DIAGNOSIS — E7800 Pure hypercholesterolemia, unspecified: Secondary | ICD-10-CM

## 2011-04-25 LAB — LIPID PANEL: Cholesterol: 226 mg/dL — ABNORMAL HIGH (ref 0–200)

## 2011-04-25 LAB — CBC WITH DIFFERENTIAL/PLATELET
Basophils Absolute: 0 10*3/uL (ref 0.0–0.1)
Basophils Relative: 0.4 % (ref 0.0–3.0)
HCT: 48 % (ref 39.0–52.0)
Hemoglobin: 15.9 g/dL (ref 13.0–17.0)
Lymphocytes Relative: 22.6 % (ref 12.0–46.0)
Lymphs Abs: 1.2 10*3/uL (ref 0.7–4.0)
MCHC: 33.2 g/dL (ref 30.0–36.0)
Monocytes Relative: 7.6 % (ref 3.0–12.0)
Neutro Abs: 3.4 10*3/uL (ref 1.4–7.7)
RBC: 5.47 Mil/uL (ref 4.22–5.81)
RDW: 14.8 % — ABNORMAL HIGH (ref 11.5–14.6)

## 2011-04-25 LAB — POCT URINALYSIS DIPSTICK
Bilirubin, UA: NEGATIVE
Blood, UA: NEGATIVE
Glucose, UA: NEGATIVE
Ketones, UA: NEGATIVE
Spec Grav, UA: 1.025
Urobilinogen, UA: 0.2

## 2011-04-25 LAB — TSH: TSH: 1.37 u[IU]/mL (ref 0.35–5.50)

## 2011-04-25 LAB — HEPATIC FUNCTION PANEL
ALT: 19 U/L (ref 0–53)
AST: 21 U/L (ref 0–37)
Albumin: 3.7 g/dL (ref 3.5–5.2)
Alkaline Phosphatase: 93 U/L (ref 39–117)
Total Protein: 6.3 g/dL (ref 6.0–8.3)

## 2011-04-25 LAB — PSA: PSA: 2.27 ng/mL (ref 0.10–4.00)

## 2011-04-25 LAB — BASIC METABOLIC PANEL
CO2: 30 mEq/L (ref 19–32)
Calcium: 9.1 mg/dL (ref 8.4–10.5)
GFR: 86.65 mL/min (ref >60.00)
Glucose, Bld: 130 mg/dL — ABNORMAL HIGH (ref 70–99)
Potassium: 5.3 mEq/L — ABNORMAL HIGH (ref 3.5–5.1)
Sodium: 143 mEq/L (ref 135–145)

## 2011-04-25 LAB — LDL CHOLESTEROL, DIRECT: Direct LDL: 146.7 mg/dL

## 2011-04-29 ENCOUNTER — Encounter: Payer: Self-pay | Admitting: Family Medicine

## 2011-04-29 ENCOUNTER — Ambulatory Visit (INDEPENDENT_AMBULATORY_CARE_PROVIDER_SITE_OTHER): Payer: Medicare Other | Admitting: Family Medicine

## 2011-04-29 DIAGNOSIS — K219 Gastro-esophageal reflux disease without esophagitis: Secondary | ICD-10-CM

## 2011-04-29 DIAGNOSIS — J309 Allergic rhinitis, unspecified: Secondary | ICD-10-CM

## 2011-04-29 DIAGNOSIS — E7800 Pure hypercholesterolemia, unspecified: Secondary | ICD-10-CM

## 2011-04-29 DIAGNOSIS — M459 Ankylosing spondylitis of unspecified sites in spine: Secondary | ICD-10-CM

## 2011-04-29 DIAGNOSIS — Z23 Encounter for immunization: Secondary | ICD-10-CM

## 2011-04-29 DIAGNOSIS — I10 Essential (primary) hypertension: Secondary | ICD-10-CM

## 2011-04-29 DIAGNOSIS — E7801 Familial hypercholesterolemia: Secondary | ICD-10-CM

## 2011-04-29 DIAGNOSIS — M1990 Unspecified osteoarthritis, unspecified site: Secondary | ICD-10-CM

## 2011-04-29 DIAGNOSIS — F528 Other sexual dysfunction not due to a substance or known physiological condition: Secondary | ICD-10-CM

## 2011-04-29 MED ORDER — ATENOLOL 50 MG PO TABS: 50.0000 mg | ORAL_TABLET | Freq: Every day | ORAL | Status: DC

## 2011-04-29 MED ORDER — EZETIMIBE 10 MG PO TABS: 10.0000 mg | ORAL_TABLET | Freq: Every day | ORAL | Status: DC

## 2011-04-29 MED ORDER — OMEPRAZOLE 20 MG PO CPDR: 20.0000 mg | DELAYED_RELEASE_CAPSULE | Freq: Every day | ORAL | Status: DC

## 2011-04-29 MED ORDER — SILDENAFIL CITRATE 100 MG PO TABS: 100.0000 mg | ORAL_TABLET | ORAL | Status: DC | PRN

## 2011-04-29 MED ORDER — INDOMETHACIN ER 75 MG PO CPCR: 75.0000 mg | ORAL_CAPSULE | Freq: Two times a day (BID) | ORAL | Status: DC

## 2011-04-29 NOTE — Patient Instructions (Signed)
Continue your good health habits.  Walk 30 minutes a day in [place].  My son-in-law's name is Dr. Jonette Mate     ..........Marland Kitchen     [facility]

## 2011-04-29 NOTE — Progress Notes (Signed)
  Subjective:    Patient ID: Terry Rogers, male    DOB: Apr 20, 1941, 70 y.o.   MRN: [id number]  HPI Terry Rogers is a 69 year old male, nonsmoker, who comes in today for Medicare wellness examination because of a history of hypertension, hyperlipidemia, allergic rhinitis osteoarthritis reflux esophagitis, and erectile dysfunction.  He states that he has a had a good year except his right shoulder dislocated again.  If the third episode.  His orthopedist is Dr. Eulah Pont.  He takes Tenormin 50 mg daily for hypertension.  BP 118/80.  For hyperlipidemia takes Zetia 10 mg daily.  For osteoarthritis, and ankylosing spondylitis.  He takes indomethacin 75 mg b.i.d.  He takes Prilosec 20 daily for reflux.  He also takes Cipro 100 mg p.r.n. For ED.     Review of Systems  Constitutional: Negative.   HENT: Negative.   Eyes: Negative.   Respiratory: Negative.   Cardiovascular: Negative.   Gastrointestinal: Negative.   Genitourinary: Negative.   Musculoskeletal: Negative.   Skin: Negative.   Neurological: Negative.   Hematological: Negative.   Psychiatric/Behavioral: Negative.        Objective:   Physical Exam  Constitutional: He is oriented to person, place, and time. He appears well-developed and well-nourished.  HENT:  Head: Normocephalic and atraumatic.  Right Ear: External ear normal.  Left Ear: External ear normal.  Nose: Nose normal.  Mouth/Throat: Oropharynx is clear and moist.  Eyes: Conjunctivae and EOM are normal. Pupils are equal, round, and reactive to light.  Neck: Normal range of motion. Neck supple. No JVD present. No tracheal deviation present. No thyromegaly present.  Cardiovascular: Normal rate, regular rhythm, normal heart sounds and intact distal pulses.  Exam reveals no gallop and no friction rub.   No murmur heard. Pulmonary/Chest: Effort normal and breath sounds normal. No stridor. No respiratory distress. He has no wheezes. He has no rales. He exhibits no  tenderness.  Abdominal: Soft. Bowel sounds are normal. He exhibits no distension and no mass. There is no tenderness. There is no rebound and no guarding.  Genitourinary: Rectum normal, prostate normal and penis normal. Guaiac negative stool. No penile tenderness.  Musculoskeletal: Normal range of motion. He exhibits no edema and no tenderness.  Lymphadenopathy:    He has no cervical adenopathy.  Neurological: He is alert and oriented to person, place, and time. He has normal reflexes. No cranial nerve deficit. He exhibits normal muscle tone.  Skin: Skin is warm and dry. No rash noted. No erythema. No pallor.  Psychiatric: He has a normal mood and affect. His behavior is normal. Judgment and thought content normal.          Assessment & Plan:  Healthy male.  History of hypertension.  Continue Tenormin, 50 mg daily.  History of hyperlipidemia.  Continue CEA, 10 mg daily.  History of osteoarthritis and ankylosing spondylitis.  Continue indomethacin 75 mg daily.  History of reflux continue Prilosec 20 mg daily.  History of erectile dysfunction continue Viagra p.r.n.  Return one year, sooner if any problem

## 2011-05-02 ENCOUNTER — Ambulatory Visit (HOSPITAL_COMMUNITY): Payer: Medicare Other

## 2011-05-02 ENCOUNTER — Ambulatory Visit (INDEPENDENT_AMBULATORY_CARE_PROVIDER_SITE_OTHER): Payer: Medicare Other

## 2011-05-02 ENCOUNTER — Inpatient Hospital Stay (INDEPENDENT_AMBULATORY_CARE_PROVIDER_SITE_OTHER)
Admission: RE | Admit: 2011-05-02 | Discharge: 2011-05-02 | Disposition: A | Discharge status: Home / Self Care | Payer: Medicare Other | Source: Ambulatory Visit | Attending: Family Medicine | Admitting: Family Medicine

## 2011-05-02 DIAGNOSIS — S43006A Unspecified dislocation of unspecified shoulder joint, initial encounter: Secondary | ICD-10-CM

## 2011-05-06 ENCOUNTER — Other Ambulatory Visit: Payer: Self-pay | Admitting: Orthopedic Surgery

## 2011-05-06 DIAGNOSIS — M25511 Pain in right shoulder: Secondary | ICD-10-CM

## 2011-05-13 ENCOUNTER — Telehealth: Payer: Self-pay | Admitting: Family Medicine

## 2011-05-13 NOTE — Telephone Encounter (Signed)
Transderm-Scop patches........Marland Kitchen Apply 8 hours prior to sailing................ One patch last 3 days

## 2011-05-13 NOTE — Telephone Encounter (Signed)
Pt is going on a cruise and is requesting an RX for a sea sick patch [facility] [address]

## 2011-05-14 ENCOUNTER — Ambulatory Visit
Admission: RE | Admit: 2011-05-14 | Discharge: 2011-05-14 | Disposition: A | Discharge status: Home / Self Care | Payer: Medicare Other | Source: Ambulatory Visit | Attending: Orthopedic Surgery | Admitting: Orthopedic Surgery

## 2011-05-14 DIAGNOSIS — M25511 Pain in right shoulder: Secondary | ICD-10-CM

## 2011-05-14 MED ORDER — IOHEXOL 180 MG/ML  SOLN
15.0000 mL | Freq: Once | INTRAMUSCULAR | Status: AC | PRN
  Administered 2011-05-14: 15 mL via INTRA_ARTICULAR

## 2011-05-15 MED ORDER — SCOPOLAMINE 1 MG/3DAYS TD PT72: MEDICATED_PATCH | TRANSDERMAL | Status: DC

## 2011-05-22 ENCOUNTER — Telehealth: Payer: Self-pay | Admitting: Family Medicine

## 2011-05-22 NOTE — Telephone Encounter (Signed)
done

## 2011-05-22 NOTE — Telephone Encounter (Signed)
I received the approval fax from [place]. Tried to call pt to let him know, since he already paid for the patches out of pocket. I got no answer via telephone. If he calls, please let him know the Rx was approved. Thank you.

## 2011-05-22 NOTE — Telephone Encounter (Signed)
Pt is aware waiting on [place] approval for sea sickness patches. Pt will pay out of pocket. Pt is aware if patches are denied he may not get reimburse.

## 2011-05-22 NOTE — Telephone Encounter (Signed)
noted 

## 2011-09-16 ENCOUNTER — Telehealth: Payer: Self-pay | Admitting: Family Medicine

## 2011-09-16 DIAGNOSIS — G479 Sleep disorder, unspecified: Secondary | ICD-10-CM

## 2011-09-16 NOTE — Telephone Encounter (Signed)
Referring him to Dr. Mellody Dance clance in pulmonary

## 2011-09-16 NOTE — Telephone Encounter (Signed)
Pt called and is req to get a recommendation to has a sleep apnea test done. Pls call.

## 2011-10-07 ENCOUNTER — Institutional Professional Consult (permissible substitution): Payer: Medicare Other | Admitting: Pulmonary Disease

## 2011-10-22 ENCOUNTER — Encounter: Payer: Self-pay | Admitting: Pulmonary Disease

## 2011-10-22 ENCOUNTER — Ambulatory Visit (INDEPENDENT_AMBULATORY_CARE_PROVIDER_SITE_OTHER): Payer: Medicare Other | Admitting: Pulmonary Disease

## 2011-10-22 VITALS — BP 122/78 | HR 66 | Temp 98.3°F | Ht 71.0 in | Wt 225.8 lb

## 2011-10-22 DIAGNOSIS — R0609 Other forms of dyspnea: Secondary | ICD-10-CM

## 2011-10-22 DIAGNOSIS — R0683 Snoring: Secondary | ICD-10-CM | POA: Insufficient documentation

## 2011-10-22 NOTE — Assessment & Plan Note (Signed)
The patient has a history of loud snoring, but feels that he sleeps very well with rare awakening.  He is rested in the mornings upon arising, and has no alert his issues during the day even with periods of inactivity.  His Epworth sleepiness score today is only 2.  Based on his history and exam, I think it is unlikely that he has clinically significant sleep disordered breathing.  He is having a lot of nasal congestion issues, and we'll give him a sample of Nasonex to try.  This may also help his snoring.  I have also encouraged him to work aggressively on weight loss as well.  If he notices worsening symptoms, I would be happy to reevaluate for sleep disordered breathing.

## 2011-10-22 NOTE — Patient Instructions (Signed)
Work on weight loss Will try nasonex 2 sprays each nostril to see if helps with congestion and snoring.  If it does not, I would recommend ENT evaluation for your chronic nasal congestion Will hold off on sleep study for now, but please call if you are becoming more symptomatic and we can re-evaluate.

## 2011-10-22 NOTE — Progress Notes (Signed)
  Subjective:    Patient ID: Terry Rogers, male    DOB: 1941-06-26, 71 y.o.   MRN: [id number]  HPI The patient is a 71 year old male who I've been asked to see for possible obstructive sleep apnea.  He has been noted to have loud snoring by his wife, and he typically will sleep in a different room.  His wife has not commented on an abnormal breathing pattern, and he denies any choking arousals during the night.  He only has one awakening at the most during the night, and feels rested in the mornings upon arising.  He has no alert his shoes during the day, even with periods of inactivity.  He has no issues in the evening doing quiet tasks, and denies any sleepiness with driving.  His weight is up 10 pounds in the last 2 years, and his Epworth score today is only 2.  Sleep Questionnaire: What time do you typically go to bed?( Between what hours) 11:15p How long does it take you to fall asleep? 20 minutes How many times during the night do you wake up? 1 What time do you get out of bed to start your day? 0715 Do you drive or operate heavy machinery in your occupation? No How much has your weight changed (up or down) over the past two years? (In pounds) 10 lb (4.536 kg) Have you ever had a sleep study before? No Do you currently use CPAP? No Do you wear oxygen at any time? No    Review of Systems  Constitutional: Negative.  Negative for fever and unexpected weight change.  HENT: Positive for congestion. Negative for ear pain, nosebleeds, sore throat, rhinorrhea, sneezing, trouble swallowing, dental problem, postnasal drip and sinus pressure.   Eyes: Negative.  Negative for redness and itching.  Respiratory: Negative.  Negative for cough, chest tightness, shortness of breath and wheezing.   Cardiovascular: Negative.  Negative for palpitations and leg swelling.  Gastrointestinal: Negative.  Negative for nausea and vomiting.       Heartburn   Genitourinary: Negative.  Negative for dysuria.    Musculoskeletal: Negative.  Negative for joint swelling.  Skin: Negative.  Negative for rash.  Neurological: Negative.  Negative for headaches.  Hematological: Negative.  Does not bruise/bleed easily.  Psychiatric/Behavioral: Negative.  Negative for dysphoric mood. The patient is not nervous/anxious.        Objective:   Physical Exam Constitutional:  Overweight male, no acute distress  HENT:  Nares patent without discharge  Oropharynx without exudate, palate is normal, uvula thick and elongated.   Eyes:  Perrla, eomi, no scleral icterus  Neck:  No JVD, no TMG  Cardiovascular:  Normal rate, regular rhythm, no rubs or gallops.  No murmurs        Intact distal pulses  Pulmonary :  Normal breath sounds, no stridor or respiratory distress   No rales, rhonchi, or wheezing  Abdominal:  Soft, nondistended, bowel sounds present.  No tenderness noted.   Musculoskeletal:  No lower extremity edema noted.  Lymph Nodes:  No cervical lymphadenopathy noted  Skin:  No cyanosis noted  Neurologic:  Alert, appropriate, moves all 4 extremities without obvious deficit.         Assessment & Plan:

## 2011-11-19 ENCOUNTER — Telehealth: Payer: Self-pay | Admitting: Family Medicine

## 2011-11-19 ENCOUNTER — Telehealth: Payer: Self-pay | Admitting: Pulmonary Disease

## 2011-11-19 MED ORDER — MOMETASONE FUROATE 50 MCG/ACT NA SUSP: 2.0000 | Freq: Every day | NASAL | Status: DC

## 2011-11-19 NOTE — Telephone Encounter (Signed)
I spoke with pt and is aware of KC recs. Rx has been sent

## 2011-11-19 NOTE — Telephone Encounter (Signed)
Ok to call in script. If not getting better enough, can consider ENT referral for possible surgery.

## 2011-11-19 NOTE — Telephone Encounter (Signed)
Terry Rogers rcvd a letter from [facility] stating that its time for Terry Rogers to sch next colonoscopy. Terry Rogers said that he believes that he had one done approx 2 years ago and would like to know if Dr Tawanna Cooler recommends Terry Rogers to have another done or not?

## 2011-11-19 NOTE — Telephone Encounter (Signed)
Pt notified of recs per KC. Pt verbalized understanding. Rx was sent to pharm

## 2011-11-19 NOTE — Telephone Encounter (Signed)
I spoke with pt and he states he feels like the nasonex has helped some with his nasal congestion. Not 100 % better though. [facility] Please advise [facility] thanks

## 2011-12-16 ENCOUNTER — Telehealth: Payer: Self-pay | Admitting: Internal Medicine

## 2011-12-16 ENCOUNTER — Telehealth: Payer: Self-pay | Admitting: Family Medicine

## 2011-12-16 MED ORDER — PREDNISONE 20 MG PO TABS: ORAL_TABLET | ORAL | Status: DC

## 2011-12-16 NOTE — Telephone Encounter (Signed)
Okay to call in the prednisone 20 mg tabs dispense 30 directions 2 tabs x3 days, 1 tab x3 days, a half a tab x3 days, then a half a tablet Monday Wednesday Friday for a 2 week taper refills x1

## 2011-12-16 NOTE — Telephone Encounter (Signed)
Also have him call GI to find out why he is getting a recall card after 2 years

## 2011-12-16 NOTE — Telephone Encounter (Signed)
Pt called and has poison ivy. [facility] [address]   Also pt rcvd a post card from [facility] stating that its time for another colonoscopy, but pt just had one done 2 years ago.

## 2011-12-16 NOTE — Telephone Encounter (Signed)
Spoke with pt's wife who asked that I call back at 11am.

## 2011-12-16 NOTE — Telephone Encounter (Signed)
Please advise 

## 2011-12-16 NOTE — Telephone Encounter (Signed)
Patient informed per Dr. Tawanna Cooler Prednisone 20mg  tapering dose sent in [facility] [address]

## 2011-12-17 NOTE — Telephone Encounter (Signed)
Does not need colonoscopy now. I recommend he have a routine one in 2022.  He has received a recall letter from [facility] related to colonoscopy prior to 2012 (based upon chart review I did)   He should see me in the office to discuss the diarrhea and possible treatment for that

## 2011-12-17 NOTE — Telephone Encounter (Signed)
Recall COLON states 2022 ; last COLON 10/30/10 with hyperplastic polyp on path. Phone note on 11/08/10 you wrote for 10 year recall. Pt states he thought you said 5 years d/t a polyp and he got a Recall notice in the mail stating he is due now. Pt is OK either way, but wants clarification. He also reports he still has some diarrhea. Please advise. Thanks.

## 2011-12-17 NOTE — Telephone Encounter (Signed)
Left message for patient to call back  

## 2011-12-19 NOTE — Telephone Encounter (Signed)
Left message for patient to call back  

## 2011-12-23 NOTE — Telephone Encounter (Signed)
Patient's wife advised.  She will have him call back for an appt if he still has diarrhea and needs an appt.  Recall colon is in place for 10/2020

## 2012-03-04 ENCOUNTER — Telehealth: Payer: Self-pay | Admitting: Family Medicine

## 2012-03-04 NOTE — Telephone Encounter (Signed)
Terry Rogers call him to remind him that insurance won't pay if it's not been a year

## 2012-03-04 NOTE — Telephone Encounter (Signed)
Left message with wife for patient to schedule CPX after 04/28/12

## 2012-03-04 NOTE — Telephone Encounter (Signed)
Last cpx 04/29/11 okay to schedule?

## 2012-03-04 NOTE — Telephone Encounter (Signed)
Pt scheduled for CPE 05/18/12 but stated he has a house in [place] they visit a lot in the winter and he would like to be seen in [place]..please advise

## 2012-03-06 NOTE — Telephone Encounter (Signed)
Pt called re: getting an earlier cpx sch. Pt was informed that his cpx needs to be after 04/28/12 because insurance would not cover if it's not been a yr. Pt said that he called his insurance and they told the pt that, his cpx can be anytime in the month of October and it would be covered. Pt has been schd for cpx on 04/16/12 with fasting labs on 04/09/12. Pt aware that if his insurance will not pay, that he would be responsible for the bill.

## 2012-04-03 ENCOUNTER — Other Ambulatory Visit: Payer: Self-pay | Admitting: *Deleted

## 2012-04-03 DIAGNOSIS — E7801 Familial hypercholesterolemia: Secondary | ICD-10-CM

## 2012-04-03 MED ORDER — EZETIMIBE 10 MG PO TABS: 10.0000 mg | ORAL_TABLET | Freq: Every day | ORAL | Status: DC

## 2012-04-09 ENCOUNTER — Other Ambulatory Visit (INDEPENDENT_AMBULATORY_CARE_PROVIDER_SITE_OTHER): Payer: Medicare Other

## 2012-04-09 DIAGNOSIS — Z125 Encounter for screening for malignant neoplasm of prostate: Secondary | ICD-10-CM

## 2012-04-09 DIAGNOSIS — Z79899 Other long term (current) drug therapy: Secondary | ICD-10-CM

## 2012-04-09 DIAGNOSIS — Z0000 Encounter for general adult medical examination without abnormal findings: Secondary | ICD-10-CM

## 2012-04-09 DIAGNOSIS — Z13220 Encounter for screening for lipoid disorders: Secondary | ICD-10-CM

## 2012-04-09 LAB — CBC WITH DIFFERENTIAL/PLATELET
Basophils Relative: 0.9 % (ref 0.0–3.0)
Eosinophils Relative: 2.6 % (ref 0.0–5.0)
Hemoglobin: 16.2 g/dL (ref 13.0–17.0)
MCV: 87.9 fl (ref 78.0–100.0)
Monocytes Absolute: 0.4 10*3/uL (ref 0.1–1.0)
Neutro Abs: 3.6 10*3/uL (ref 1.4–7.7)
Neutrophils Relative %: 69.3 % (ref 43.0–77.0)
RBC: 5.53 Mil/uL (ref 4.22–5.81)
WBC: 5.1 10*3/uL (ref 4.5–10.5)

## 2012-04-09 LAB — POCT URINALYSIS DIPSTICK
Bilirubin, UA: NEGATIVE
Leukocytes, UA: NEGATIVE
Nitrite, UA: NEGATIVE
Protein, UA: NEGATIVE
pH, UA: 5.5

## 2012-04-09 LAB — LIPID PANEL
HDL: 51.6 mg/dL (ref >39.00)
Total CHOL/HDL Ratio: 4
Triglycerides: 159 mg/dL — ABNORMAL HIGH (ref 0.0–149.0)

## 2012-04-09 LAB — BASIC METABOLIC PANEL
Chloride: 106 mEq/L (ref 96–112)
Creatinine, Ser: 0.9 mg/dL (ref 0.4–1.5)
Potassium: 5.2 mEq/L — ABNORMAL HIGH (ref 3.5–5.1)
Sodium: 140 mEq/L (ref 135–145)

## 2012-04-09 LAB — LDL CHOLESTEROL, DIRECT: Direct LDL: 151.5 mg/dL

## 2012-04-09 LAB — HEPATIC FUNCTION PANEL
Albumin: 3.7 g/dL (ref 3.5–5.2)
Total Protein: 6.1 g/dL (ref 6.0–8.3)

## 2012-04-16 ENCOUNTER — Encounter: Payer: Self-pay | Admitting: Family Medicine

## 2012-04-16 ENCOUNTER — Ambulatory Visit (INDEPENDENT_AMBULATORY_CARE_PROVIDER_SITE_OTHER): Payer: Medicare Other | Admitting: Family Medicine

## 2012-04-16 VITALS — BP 130/80 | HR 68 | Temp 98.2°F | Resp 16 | Ht 71.0 in | Wt 216.0 lb

## 2012-04-16 DIAGNOSIS — E7801 Familial hypercholesterolemia: Secondary | ICD-10-CM

## 2012-04-16 DIAGNOSIS — E78019 Familial hypercholesterolemia, unspecified: Secondary | ICD-10-CM

## 2012-04-16 DIAGNOSIS — F528 Other sexual dysfunction not due to a substance or known physiological condition: Secondary | ICD-10-CM

## 2012-04-16 DIAGNOSIS — K219 Gastro-esophageal reflux disease without esophagitis: Secondary | ICD-10-CM

## 2012-04-16 DIAGNOSIS — M459 Ankylosing spondylitis of unspecified sites in spine: Secondary | ICD-10-CM

## 2012-04-16 DIAGNOSIS — J301 Allergic rhinitis due to pollen: Secondary | ICD-10-CM

## 2012-04-16 DIAGNOSIS — E7800 Pure hypercholesterolemia, unspecified: Secondary | ICD-10-CM

## 2012-04-16 DIAGNOSIS — I10 Essential (primary) hypertension: Secondary | ICD-10-CM

## 2012-04-16 DIAGNOSIS — Z8719 Personal history of other diseases of the digestive system: Secondary | ICD-10-CM

## 2012-04-16 DIAGNOSIS — Z23 Encounter for immunization: Secondary | ICD-10-CM

## 2012-04-16 DIAGNOSIS — M1990 Unspecified osteoarthritis, unspecified site: Secondary | ICD-10-CM

## 2012-04-16 DIAGNOSIS — Z0000 Encounter for general adult medical examination without abnormal findings: Secondary | ICD-10-CM

## 2012-04-16 MED ORDER — EZETIMIBE 10 MG PO TABS: 10.0000 mg | ORAL_TABLET | Freq: Every day | ORAL | Status: DC

## 2012-04-16 MED ORDER — ATENOLOL 50 MG PO TABS: 50.0000 mg | ORAL_TABLET | Freq: Every day | ORAL | Status: DC

## 2012-04-16 MED ORDER — MOMETASONE FUROATE 50 MCG/ACT NA SUSP: 2.0000 | Freq: Every day | NASAL | Status: DC

## 2012-04-16 MED ORDER — OMEPRAZOLE 20 MG PO CPDR: 20.0000 mg | DELAYED_RELEASE_CAPSULE | Freq: Every day | ORAL | Status: DC

## 2012-04-16 MED ORDER — INDOMETHACIN ER 75 MG PO CPCR: 75.0000 mg | ORAL_CAPSULE | Freq: Two times a day (BID) | ORAL | Status: DC

## 2012-04-16 MED ORDER — SILDENAFIL CITRATE 100 MG PO TABS: 100.0000 mg | ORAL_TABLET | ORAL | Status: DC | PRN

## 2012-04-16 MED ORDER — HYDROCORTISONE ACETATE 25 MG RE SUPP: 25.0000 mg | Freq: Two times a day (BID) | RECTAL | Status: DC

## 2012-04-16 NOTE — Patient Instructions (Addendum)
Take milk of magnesia or prune juice as a stool softener  Use one of the medicated suppositories nightly for the next 12 nights  If the bleeding persists after that call Dr. Laural Benes a gastroenterologist  Continue your other medications  Followup in 1 year sooner if any problems

## 2012-04-16 NOTE — Progress Notes (Signed)
  Subjective:    Patient ID: Terry Rogers, male    DOB: [birth year], 71 y.o.   MRN: [id number]  HPI Kashus is a 71 year old married male nonsmoker who comes in today for a Medicare wellness examination because of a history of hypertension, mild hyperlipidemia, ankylosing spondylitis, allergic rhinitis, reflux esophagitis, erectile dysfunction  His medicines reviewed there've been no changes  He states for the past 3 weeks he said bright red painless rectal bleeding. Colonoscopy a year ago by Dr. Laural Benes showed polyps otherwise normal.  His cognitive function is normal he walks on a regular basis home health safety reviewed no issues identified, no guns in the house, he does have a health care power of attorney and living well.  Seasonal flu shot today, tetanus 2006, Pneumovax 2008, shingles 2011   Review of Systems  Constitutional: Negative.   HENT: Negative.   Eyes: Negative.   Respiratory: Negative.   Cardiovascular: Negative.   Gastrointestinal: Negative.   Genitourinary: Negative.   Musculoskeletal: Negative.   Skin: Negative.   Neurological: Negative.   Hematological: Negative.   Psychiatric/Behavioral: Negative.        Objective:   Physical Exam  Constitutional: He is oriented to person, place, and time. He appears well-developed and well-nourished.  HENT:  Head: Normocephalic and atraumatic.  Right Ear: External ear normal.  Left Ear: External ear normal.  Nose: Nose normal.  Mouth/Throat: Oropharynx is clear and moist.  Eyes: Conjunctivae normal and EOM are normal. Pupils are equal, round, and reactive to light.  Neck: Normal range of motion. Neck supple. No JVD present. No tracheal deviation present. No thyromegaly present.  Cardiovascular: Normal rate, regular rhythm, normal heart sounds and intact distal pulses.  Exam reveals no gallop and no friction rub.   No murmur heard. Pulmonary/Chest: Effort normal and breath sounds normal. No stridor. No respiratory  distress. He has no wheezes. He has no rales. He exhibits no tenderness.  Abdominal: Soft. Bowel sounds are normal. He exhibits no distension and no mass. There is no tenderness. There is no rebound and no guarding.  Genitourinary: Rectum normal, prostate normal and penis normal. Guaiac negative stool. No penile tenderness.       External exam normal prostate 1+ symmetrical BPH brown stool guaiac negative no palpable masses  Parenthetically he says he's had a history of repeated bright red rectal bleeding in the past typically respond to conservative therapy he doesn't take any aspirin but he does take the Indocin  Musculoskeletal: Normal range of motion. He exhibits no edema and no tenderness.  Lymphadenopathy:    He has no cervical adenopathy.  Neurological: He is alert and oriented to person, place, and time. He has normal reflexes. No cranial nerve deficit. He exhibits normal muscle tone.  Skin: Skin is warm and dry. No rash noted. No erythema. No pallor.  Psychiatric: He has a normal mood and affect. His behavior is normal. Judgment and thought content normal.          Assessment & Plan:  Healthy male  Hypertension continue Tenormin 50 mg daily  Hyperlipidemia continue Zetia 10 mg daily  Ankylosing spondylitis continue indomethacin 75 mg twice a day  Allergic rhinitis continue Nasonex nasal spray  Esophagitis secondary indomethacin...Marland KitchenMarland KitchenMarland Kitchen Prilosec 20 mg daily  Tobacco dysfunction continue Viagra 100 mg when necessary  Bright red rectal bleeding stool softeners Anusol repeat GI consult if symptoms persist

## 2012-04-17 ENCOUNTER — Encounter: Payer: Self-pay | Admitting: Family Medicine

## 2012-04-21 NOTE — Telephone Encounter (Signed)
okj

## 2012-05-11 ENCOUNTER — Other Ambulatory Visit: Payer: Medicare Other

## 2012-05-18 ENCOUNTER — Encounter: Payer: Medicare Other | Admitting: Family Medicine

## 2012-05-31 ENCOUNTER — Encounter: Payer: Self-pay | Admitting: Family Medicine

## 2012-06-02 ENCOUNTER — Encounter: Payer: Self-pay | Admitting: Family Medicine

## 2012-06-02 MED ORDER — EZETIMIBE 10 MG PO TABS: 10.0000 mg | ORAL_TABLET | Freq: Every day | ORAL | Status: DC

## 2012-06-02 NOTE — Telephone Encounter (Signed)
[facility]  

## 2012-06-04 ENCOUNTER — Encounter: Payer: Self-pay | Admitting: Family Medicine

## 2012-06-05 MED ORDER — EZETIMIBE 10 MG PO TABS: 10.0000 mg | ORAL_TABLET | Freq: Every day | ORAL | Status: DC

## 2012-06-05 NOTE — Addendum Note (Signed)
Addended by: Kern Reap B on: 06/05/2012 09:29 AM   Modules accepted: Orders

## 2012-06-05 NOTE — Telephone Encounter (Signed)
[facility]

## 2012-12-03 ENCOUNTER — Other Ambulatory Visit: Payer: Self-pay | Admitting: Family Medicine

## 2013-01-23 ENCOUNTER — Encounter: Payer: Self-pay | Admitting: Family Medicine

## 2013-01-25 MED ORDER — PREDNISONE 20 MG PO TABS: 20.0000 mg | ORAL_TABLET | Freq: Every day | ORAL | Status: DC

## 2013-02-09 ENCOUNTER — Other Ambulatory Visit: Payer: Self-pay | Admitting: Family Medicine

## 2013-03-06 ENCOUNTER — Encounter: Payer: Self-pay | Admitting: Family Medicine

## 2013-03-07 ENCOUNTER — Encounter: Payer: Self-pay | Admitting: Family Medicine

## 2013-03-13 ENCOUNTER — Encounter: Payer: Self-pay | Admitting: Family Medicine

## 2013-03-16 MED ORDER — PREDNISONE 20 MG PO TABS: 20.0000 mg | ORAL_TABLET | Freq: Every day | ORAL | Status: DC

## 2013-03-16 NOTE — Telephone Encounter (Signed)
Spoke with patient.

## 2013-03-17 ENCOUNTER — Other Ambulatory Visit: Payer: Self-pay | Admitting: Family Medicine

## 2013-03-17 DIAGNOSIS — T148XXA Other injury of unspecified body region, initial encounter: Secondary | ICD-10-CM

## 2013-03-17 MED ORDER — METHOCARBAMOL 500 MG PO TABS: 500.0000 mg | ORAL_TABLET | Freq: Every day | ORAL | Status: DC

## 2013-03-17 MED ORDER — VARDENAFIL HCL 20 MG PO TABS: 20.0000 mg | ORAL_TABLET | Freq: Every day | ORAL | Status: DC | PRN

## 2013-03-23 ENCOUNTER — Encounter: Payer: Self-pay | Admitting: Family Medicine

## 2013-03-23 DIAGNOSIS — R21 Rash and other nonspecific skin eruption: Secondary | ICD-10-CM

## 2013-04-07 ENCOUNTER — Encounter: Payer: Self-pay | Admitting: Family Medicine

## 2013-04-07 DIAGNOSIS — M1990 Unspecified osteoarthritis, unspecified site: Secondary | ICD-10-CM

## 2013-04-07 MED ORDER — INDOMETHACIN ER 75 MG PO CPCR: 75.0000 mg | ORAL_CAPSULE | Freq: Two times a day (BID) | ORAL | Status: DC

## 2013-04-07 NOTE — Telephone Encounter (Signed)
rx sent to pharmacy

## 2013-04-13 ENCOUNTER — Other Ambulatory Visit (INDEPENDENT_AMBULATORY_CARE_PROVIDER_SITE_OTHER): Payer: Medicare Other

## 2013-04-13 DIAGNOSIS — Z125 Encounter for screening for malignant neoplasm of prostate: Secondary | ICD-10-CM

## 2013-04-13 DIAGNOSIS — R7309 Other abnormal glucose: Secondary | ICD-10-CM

## 2013-04-13 DIAGNOSIS — M1990 Unspecified osteoarthritis, unspecified site: Secondary | ICD-10-CM

## 2013-04-13 DIAGNOSIS — I10 Essential (primary) hypertension: Secondary | ICD-10-CM

## 2013-04-13 DIAGNOSIS — Z0000 Encounter for general adult medical examination without abnormal findings: Secondary | ICD-10-CM

## 2013-04-13 LAB — PSA: PSA: 3.18 ng/mL (ref 0.10–4.00)

## 2013-04-13 LAB — HEPATIC FUNCTION PANEL
ALT: 26 U/L (ref 0–53)
AST: 26 U/L (ref 0–37)
Albumin: 3.7 g/dL (ref 3.5–5.2)
Alkaline Phosphatase: 71 U/L (ref 39–117)
Total Protein: 6.1 g/dL (ref 6.0–8.3)

## 2013-04-13 LAB — CBC WITH DIFFERENTIAL/PLATELET
Eosinophils Relative: 1.9 % (ref 0.0–5.0)
Monocytes Relative: 8.6 % (ref 3.0–12.0)
Neutrophils Relative %: 66.5 % (ref 43.0–77.0)
Platelets: 165 10*3/uL (ref 150.0–400.0)
WBC: 5.5 10*3/uL (ref 4.5–10.5)

## 2013-04-13 LAB — POCT URINALYSIS DIPSTICK
Protein, UA: NEGATIVE
Spec Grav, UA: >=1.03
Urobilinogen, UA: 0.2
pH, UA: 6

## 2013-04-13 LAB — LIPID PANEL
HDL: 54.4 mg/dL (ref >39.00)
VLDL: 57.2 mg/dL — ABNORMAL HIGH (ref 0.0–40.0)

## 2013-04-13 LAB — BASIC METABOLIC PANEL
CO2: 27 mEq/L (ref 19–32)
GFR: 85.09 mL/min (ref >60.00)
Glucose, Bld: 161 mg/dL — ABNORMAL HIGH (ref 70–99)
Potassium: 4.4 mEq/L (ref 3.5–5.1)
Sodium: 140 mEq/L (ref 135–145)

## 2013-04-20 ENCOUNTER — Ambulatory Visit (INDEPENDENT_AMBULATORY_CARE_PROVIDER_SITE_OTHER): Payer: Medicare Other | Admitting: Family Medicine

## 2013-04-20 ENCOUNTER — Encounter: Payer: Self-pay | Admitting: Family Medicine

## 2013-04-20 VITALS — BP 120/78 | Temp 97.8°F | Ht 71.0 in | Wt 234.0 lb

## 2013-04-20 DIAGNOSIS — R7309 Other abnormal glucose: Secondary | ICD-10-CM

## 2013-04-20 DIAGNOSIS — M25562 Pain in left knee: Secondary | ICD-10-CM | POA: Insufficient documentation

## 2013-04-20 DIAGNOSIS — F528 Other sexual dysfunction not due to a substance or known physiological condition: Secondary | ICD-10-CM

## 2013-04-20 DIAGNOSIS — G2581 Restless legs syndrome: Secondary | ICD-10-CM

## 2013-04-20 DIAGNOSIS — R739 Hyperglycemia, unspecified: Secondary | ICD-10-CM | POA: Insufficient documentation

## 2013-04-20 DIAGNOSIS — M25569 Pain in unspecified knee: Secondary | ICD-10-CM

## 2013-04-20 DIAGNOSIS — R0609 Other forms of dyspnea: Secondary | ICD-10-CM

## 2013-04-20 DIAGNOSIS — J309 Allergic rhinitis, unspecified: Secondary | ICD-10-CM

## 2013-04-20 DIAGNOSIS — I10 Essential (primary) hypertension: Secondary | ICD-10-CM

## 2013-04-20 DIAGNOSIS — M459 Ankylosing spondylitis of unspecified sites in spine: Secondary | ICD-10-CM

## 2013-04-20 DIAGNOSIS — K219 Gastro-esophageal reflux disease without esophagitis: Secondary | ICD-10-CM

## 2013-04-20 DIAGNOSIS — R0683 Snoring: Secondary | ICD-10-CM

## 2013-04-20 DIAGNOSIS — M1990 Unspecified osteoarthritis, unspecified site: Secondary | ICD-10-CM

## 2013-04-20 MED ORDER — PRAMIPEXOLE DIHYDROCHLORIDE 0.25 MG PO TABS: ORAL_TABLET | ORAL | Status: DC

## 2013-04-20 MED ORDER — INDOMETHACIN ER 75 MG PO CPCR: 75.0000 mg | ORAL_CAPSULE | Freq: Two times a day (BID) | ORAL | Status: DC

## 2013-04-20 MED ORDER — VARDENAFIL HCL 20 MG PO TABS: 20.0000 mg | ORAL_TABLET | Freq: Every day | ORAL | Status: DC | PRN

## 2013-04-20 MED ORDER — ATENOLOL 50 MG PO TABS: 50.0000 mg | ORAL_TABLET | Freq: Every day | ORAL | Status: DC

## 2013-04-20 MED ORDER — OMEPRAZOLE 20 MG PO CPDR: 20.0000 mg | DELAYED_RELEASE_CAPSULE | Freq: Every day | ORAL | Status: DC

## 2013-04-20 MED ORDER — EZETIMIBE 10 MG PO TABS: 10.0000 mg | ORAL_TABLET | Freq: Every day | ORAL | Status: DC

## 2013-04-20 MED ORDER — METHOCARBAMOL 500 MG PO TABS: 500.0000 mg | ORAL_TABLET | Freq: Every day | ORAL | Status: DC

## 2013-04-20 NOTE — Patient Instructions (Signed)
Stop the indomethacin  Tried the Motrin 600 mg twice daily with food  Mirapex 0.25 mg,,,,,,,,,,,, one tablet each bedtime for restless leg syndrome  If you don't see any improvement in 4-6 weeks call and we will increase the dose  Elevation and ice for your left knee until you see the orthopedist  6 continue your other medications  Return in one year for general Medicare wellness examination sooner if any problem

## 2013-04-20 NOTE — Addendum Note (Signed)
Addended by: Kern Reap B on: 04/20/2013 03:02 PM   Modules accepted: Orders

## 2013-04-20 NOTE — Progress Notes (Signed)
Subjective:    Patient ID: Terry Rogers, male    DOB: January 08, 1941, 72 y.o.   MRN: [id number]  HPI Terry Rogers is a delightful 72 year old male nonsmoker who comes in today for a Medicare wellness exam because of a history of hypertension, hyperlipidemia, osteoarthritis, ankylosing spondylitis, reflux esophagitis, erectile dysfunction, and a new problem of restless leg syndrome pain in his left knee  He twisted his left knee week or 2 ago it still swollen and sore he has an appointment to go see the orthopedist  He had a rash we thought was poison ivy. We put him on a short course of prednisone it did not resolve. He went to the dermatologist they gave him some scalp steroid medication it helped but he still has a few bumps on his scalp  For the past 2 months she's not been able sleep. He wakes up with severe leg cramps and has to walk around the house a half a night. He's never had restless leg syndrome before. No history of trauma except for the injury to his left knee as noted above  Medication list reviewed and it is correct.  Cognitive function normal he walks on a regular basis home health safety reviewed no issues identified, no guns in the house, he does have a health care power of attorney and living will  He gets routine eye care, dental care, colonoscopy, vaccinations up-to-date seasonal flu shot given today   Review of Systems  Constitutional: Negative.   HENT: Negative.   Eyes: Negative.   Respiratory: Negative.   Cardiovascular: Negative.   Gastrointestinal: Negative.   Endocrine: Negative.   Genitourinary: Negative.   Musculoskeletal: Negative.   Skin: Negative.   Allergic/Immunologic: Negative.   Neurological: Negative.   Hematological: Negative.   Psychiatric/Behavioral: Negative.   All other systems reviewed and are negative.       Objective:   Physical Exam  Constitutional: He is oriented to person, place, and time. He appears well-developed and well-nourished.   HENT:  Head: Normocephalic and atraumatic.  Right Ear: External ear normal.  Left Ear: External ear normal.  Nose: Nose normal.  Mouth/Throat: Oropharynx is clear and moist.  Eyes: Conjunctivae and EOM are normal. Pupils are equal, round, and reactive to light.  Neck: Normal range of motion. Neck supple. No JVD present. No tracheal deviation present. No thyromegaly present.  Cardiovascular: Normal rate, regular rhythm, normal heart sounds and intact distal pulses.  Exam reveals no gallop and no friction rub.   No murmur heard. No carotid aortic bruits peripheral pulses 2+ and symmetrical  Pulmonary/Chest: Effort normal and breath sounds normal. No stridor. No respiratory distress. He has no wheezes. He has no rales. He exhibits no tenderness.  Abdominal: Soft. Bowel sounds are normal. He exhibits no distension and no mass. There is no tenderness. There is no rebound and no guarding.  Genitourinary: Rectum normal, prostate normal and penis normal. Guaiac negative stool. No penile tenderness.  Musculoskeletal: Normal range of motion. He exhibits no edema and no tenderness.  Left knee is swollen ligaments intact probably as a small torn cartilage  Lymphadenopathy:    He has no cervical adenopathy.  Neurological: He is alert and oriented to person, place, and time. He has normal reflexes. No cranial nerve deficit. He exhibits normal muscle tone.  Skin: Skin is warm and dry. No rash noted. No erythema. No pallor.  Total body skin exam normal  Psychiatric: He has a normal mood and affect. His behavior is normal.  Judgment and thought content normal.          Assessment & Plan:  Healthy male  Hypertension continue current medication  Hyperlipidemia continue current medication  Ankylosing spondylitis continue indomethacin 75 mg twice a day or Motrin 600 twice a day  Reflux esophagitis continue Prilosec 20 daily  Erectile dysfunction Levitra when necessary  New problem light restless  leg syndrome Mirapex each bedtime followup in 4-6 weeks if symptoms persist  Lesions on the scalp refer back to Dr. Margo Aye  Pain left knee with it effusion probably minor week in the cartilage or so consult

## 2013-04-21 ENCOUNTER — Encounter: Payer: Self-pay | Admitting: Family Medicine

## 2013-04-22 ENCOUNTER — Encounter: Payer: Self-pay | Admitting: Family Medicine

## 2013-04-23 ENCOUNTER — Encounter: Payer: Self-pay | Admitting: Family Medicine

## 2013-04-23 DIAGNOSIS — M459 Ankylosing spondylitis of unspecified sites in spine: Secondary | ICD-10-CM

## 2013-04-23 DIAGNOSIS — M1990 Unspecified osteoarthritis, unspecified site: Secondary | ICD-10-CM

## 2013-04-27 ENCOUNTER — Encounter: Payer: Self-pay | Admitting: *Deleted

## 2013-04-28 ENCOUNTER — Other Ambulatory Visit: Payer: Self-pay | Admitting: Family Medicine

## 2013-05-13 ENCOUNTER — Other Ambulatory Visit: Payer: Self-pay

## 2013-05-17 ENCOUNTER — Ambulatory Visit (HOSPITAL_BASED_OUTPATIENT_CLINIC_OR_DEPARTMENT_OTHER): Admit: 2013-05-17 | Payer: Self-pay | Admitting: Orthopedic Surgery

## 2013-05-17 ENCOUNTER — Encounter (HOSPITAL_BASED_OUTPATIENT_CLINIC_OR_DEPARTMENT_OTHER): Payer: Self-pay

## 2013-05-17 SURGERY — ARTHROSCOPY, KNEE
Anesthesia: Choice | Site: Knee | Laterality: Left

## 2013-05-18 ENCOUNTER — Ambulatory Visit
Admission: RE | Admit: 2013-05-18 | Discharge: 2013-05-18 | Disposition: A | Discharge status: Home / Self Care | Payer: Medicare Other | Source: Ambulatory Visit | Attending: Rheumatology | Admitting: Rheumatology

## 2013-05-18 ENCOUNTER — Other Ambulatory Visit (INDEPENDENT_AMBULATORY_CARE_PROVIDER_SITE_OTHER): Payer: Medicare Other

## 2013-05-18 ENCOUNTER — Other Ambulatory Visit: Payer: Self-pay | Admitting: Rheumatology

## 2013-05-18 DIAGNOSIS — M542 Cervicalgia: Secondary | ICD-10-CM

## 2013-05-18 DIAGNOSIS — R739 Hyperglycemia, unspecified: Secondary | ICD-10-CM

## 2013-05-18 DIAGNOSIS — M459 Ankylosing spondylitis of unspecified sites in spine: Secondary | ICD-10-CM

## 2013-05-18 DIAGNOSIS — R7309 Other abnormal glucose: Secondary | ICD-10-CM

## 2013-05-18 HISTORY — DX: Ankylosing spondylitis of unspecified sites in spine: M45.9

## 2013-05-18 LAB — BASIC METABOLIC PANEL
BUN: 14 mg/dL (ref 6–23)
CO2: 29 mEq/L (ref 19–32)
Calcium: 9.1 mg/dL (ref 8.4–10.5)
Creatinine, Ser: 0.9 mg/dL (ref 0.4–1.5)
Glucose, Bld: 126 mg/dL — ABNORMAL HIGH (ref 70–99)
Potassium: 4.7 mEq/L (ref 3.5–5.1)
Sodium: 138 mEq/L (ref 135–145)

## 2013-05-25 ENCOUNTER — Ambulatory Visit (INDEPENDENT_AMBULATORY_CARE_PROVIDER_SITE_OTHER): Payer: Medicare Other | Admitting: Family Medicine

## 2013-05-25 ENCOUNTER — Encounter: Payer: Self-pay | Admitting: Family Medicine

## 2013-05-25 VITALS — BP 130/84 | Temp 98.1°F | Wt 236.0 lb

## 2013-05-25 DIAGNOSIS — R739 Hyperglycemia, unspecified: Secondary | ICD-10-CM

## 2013-05-25 DIAGNOSIS — G2581 Restless legs syndrome: Secondary | ICD-10-CM

## 2013-05-25 DIAGNOSIS — R7309 Other abnormal glucose: Secondary | ICD-10-CM

## 2013-05-25 MED ORDER — PRAMIPEXOLE DIHYDROCHLORIDE 0.75 MG PO TABS: 0.7500 mg | ORAL_TABLET | Freq: Three times a day (TID) | ORAL | Status: DC

## 2013-05-25 NOTE — Patient Instructions (Signed)
Increase the Mirapex to 0.75 mg at bedtime  Continue your your diet and hopefully suture knee is better he can start walking daily  Set up a time for 6 months for followup  Fasting labs one week prior  Set up a time this winter to treat the lesions on your left hand with cryosurgery

## 2013-05-25 NOTE — Progress Notes (Signed)
  Subjective:    Patient ID: Terry Rogers, male    DOB: 1940-08-15, 72 y.o.   MRN: [id number]  HPI  Terry Rogers is a 72 year old male who comes in today for evaluation of 3 issues  We saw him for physical examination this fall his fasting blood sugar was 161. He started a diet and exercise program and weight is stable however blood sugars dropped to 126 A1c 6.3%  He's got 2 lesions on his left hand the been increasing in size  We also gave him Mirapex 0.25 mg for restless leg syndrome. It didn't help much so he doubled the dose which seems to help 5/7 nights. He would like to increase that dose.  He recently saw Dr. Eloisa Northern had a complete rheumatologic workup  Review of Systems    negative Objective:   Physical Exam  Well-developed well-nourished male no acute distress vital signs stable he is afebrile BP 130/84      Assessment & Plan:  Glucose intolerance normal blood sugar now with diet and some exercise,,,,,,, recently had knee surgery,,,,,,,, will continue diet and exercise and monitor blood sugar in 6 months  Restless leg syndrome increase Mirapex to 0.75  Lesions abnormal dorsum of left hand return for cryo-

## 2013-05-27 ENCOUNTER — Other Ambulatory Visit: Payer: Self-pay | Admitting: Family Medicine

## 2013-05-27 DIAGNOSIS — R739 Hyperglycemia, unspecified: Secondary | ICD-10-CM

## 2013-05-27 DIAGNOSIS — F528 Other sexual dysfunction not due to a substance or known physiological condition: Secondary | ICD-10-CM

## 2013-05-27 DIAGNOSIS — I10 Essential (primary) hypertension: Secondary | ICD-10-CM

## 2013-05-27 DIAGNOSIS — M1990 Unspecified osteoarthritis, unspecified site: Secondary | ICD-10-CM

## 2013-05-27 DIAGNOSIS — K219 Gastro-esophageal reflux disease without esophagitis: Secondary | ICD-10-CM

## 2013-05-31 ENCOUNTER — Other Ambulatory Visit: Payer: Self-pay | Admitting: Family Medicine

## 2013-08-09 ENCOUNTER — Encounter: Payer: Self-pay | Admitting: Family Medicine

## 2013-08-09 DIAGNOSIS — M1990 Unspecified osteoarthritis, unspecified site: Secondary | ICD-10-CM

## 2013-08-09 DIAGNOSIS — K219 Gastro-esophageal reflux disease without esophagitis: Secondary | ICD-10-CM

## 2013-08-09 DIAGNOSIS — I10 Essential (primary) hypertension: Secondary | ICD-10-CM

## 2013-08-09 DIAGNOSIS — G2581 Restless legs syndrome: Secondary | ICD-10-CM

## 2013-08-09 DIAGNOSIS — M459 Ankylosing spondylitis of unspecified sites in spine: Secondary | ICD-10-CM

## 2013-08-09 MED ORDER — OMEPRAZOLE 20 MG PO CPDR: 20.0000 mg | DELAYED_RELEASE_CAPSULE | Freq: Every day | ORAL | Status: DC

## 2013-08-09 MED ORDER — PRAMIPEXOLE DIHYDROCHLORIDE 0.75 MG PO TABS: 0.7500 mg | ORAL_TABLET | Freq: Three times a day (TID) | ORAL | Status: DC

## 2013-08-09 MED ORDER — INDOMETHACIN ER 75 MG PO CPCR: 75.0000 mg | ORAL_CAPSULE | Freq: Two times a day (BID) | ORAL | Status: DC

## 2013-08-09 MED ORDER — EZETIMIBE 10 MG PO TABS: 10.0000 mg | ORAL_TABLET | Freq: Every day | ORAL | Status: DC

## 2013-08-09 MED ORDER — TRIAMCINOLONE ACETONIDE 0.1 % EX CREA: 1.0000 "application " | TOPICAL_CREAM | Freq: Two times a day (BID) | CUTANEOUS | Status: DC

## 2013-08-09 MED ORDER — ATENOLOL 50 MG PO TABS: 50.0000 mg | ORAL_TABLET | Freq: Every day | ORAL | Status: DC

## 2013-08-09 NOTE — Addendum Note (Signed)
Addended by: Westley Hummer B on: 08/09/2013 12:32 PM   Modules accepted: Orders

## 2013-08-13 ENCOUNTER — Encounter: Payer: Self-pay | Admitting: Family Medicine

## 2013-08-13 DIAGNOSIS — G2581 Restless legs syndrome: Secondary | ICD-10-CM

## 2013-08-13 MED ORDER — PRAMIPEXOLE DIHYDROCHLORIDE 0.25 MG PO TABS: ORAL_TABLET | ORAL | Status: DC

## 2013-08-22 ENCOUNTER — Encounter: Payer: Self-pay | Admitting: Family Medicine

## 2013-08-22 DIAGNOSIS — F528 Other sexual dysfunction not due to a substance or known physiological condition: Secondary | ICD-10-CM

## 2013-08-23 ENCOUNTER — Encounter: Payer: Self-pay | Admitting: Family Medicine

## 2013-08-23 DIAGNOSIS — G2581 Restless legs syndrome: Secondary | ICD-10-CM

## 2013-08-23 MED ORDER — VARDENAFIL HCL 20 MG PO TABS: 20.0000 mg | ORAL_TABLET | Freq: Every day | ORAL | Status: DC | PRN

## 2013-08-24 ENCOUNTER — Encounter: Payer: Self-pay | Admitting: Family Medicine

## 2013-08-24 MED ORDER — PRAMIPEXOLE DIHYDROCHLORIDE 0.75 MG PO TABS: 0.7500 mg | ORAL_TABLET | Freq: Three times a day (TID) | ORAL | Status: DC

## 2013-08-24 NOTE — Telephone Encounter (Signed)
rx sent and note sent to MyChart

## 2013-08-25 ENCOUNTER — Other Ambulatory Visit: Payer: Self-pay | Admitting: Family Medicine

## 2013-08-25 DIAGNOSIS — E785 Hyperlipidemia, unspecified: Secondary | ICD-10-CM

## 2013-08-25 DIAGNOSIS — E782 Mixed hyperlipidemia: Secondary | ICD-10-CM | POA: Insufficient documentation

## 2013-08-25 MED ORDER — PRAVASTATIN SODIUM 20 MG PO TABS: 20.0000 mg | ORAL_TABLET | Freq: Every day | ORAL | Status: DC

## 2013-08-26 MED ORDER — TRETINOIN 0.01 % EX GEL: Freq: Every day | CUTANEOUS | Status: DC

## 2013-08-26 NOTE — Telephone Encounter (Signed)
Rx sent to pharmacy   

## 2013-08-28 ENCOUNTER — Encounter: Payer: Self-pay | Admitting: Family Medicine

## 2013-08-31 ENCOUNTER — Ambulatory Visit: Payer: Medicare Other | Admitting: Family Medicine

## 2013-09-01 ENCOUNTER — Encounter: Payer: Self-pay | Admitting: Family Medicine

## 2013-09-02 ENCOUNTER — Encounter: Payer: Self-pay | Admitting: Family Medicine

## 2013-09-14 ENCOUNTER — Encounter: Payer: Self-pay | Admitting: Family Medicine

## 2013-10-23 ENCOUNTER — Encounter: Payer: Self-pay | Admitting: Family Medicine

## 2013-10-27 MED ORDER — PRAVASTATIN SODIUM 20 MG PO TABS: 20.0000 mg | ORAL_TABLET | Freq: Every day | ORAL | Status: DC

## 2013-11-01 ENCOUNTER — Other Ambulatory Visit: Payer: Medicare Other

## 2013-11-02 ENCOUNTER — Other Ambulatory Visit (INDEPENDENT_AMBULATORY_CARE_PROVIDER_SITE_OTHER): Payer: Medicare HMO

## 2013-11-02 DIAGNOSIS — R739 Hyperglycemia, unspecified: Secondary | ICD-10-CM

## 2013-11-02 DIAGNOSIS — T148XXA Other injury of unspecified body region, initial encounter: Secondary | ICD-10-CM

## 2013-11-02 DIAGNOSIS — E785 Hyperlipidemia, unspecified: Secondary | ICD-10-CM

## 2013-11-02 DIAGNOSIS — R7309 Other abnormal glucose: Secondary | ICD-10-CM

## 2013-11-02 LAB — CBC WITH DIFFERENTIAL/PLATELET
BASOS ABS: 0 10*3/uL (ref 0.0–0.1)
Basophils Relative: 1.2 % (ref 0.0–3.0)
EOS ABS: 0.1 10*3/uL (ref 0.0–0.7)
Eosinophils Relative: 4.4 % (ref 0.0–5.0)
HCT: 40.2 % (ref 39.0–52.0)
HEMOGLOBIN: 13 g/dL (ref 13.0–17.0)
Lymphocytes Relative: 26.1 % (ref 12.0–46.0)
Lymphs Abs: 0.8 10*3/uL (ref 0.7–4.0)
MCHC: 32.4 g/dL (ref 30.0–36.0)
MCV: 80.1 fl (ref 78.0–100.0)
Monocytes Absolute: 0.4 10*3/uL (ref 0.1–1.0)
Monocytes Relative: 12.9 % — ABNORMAL HIGH (ref 3.0–12.0)
NEUTROS ABS: 1.7 10*3/uL (ref 1.4–7.7)
Neutrophils Relative %: 55.4 % (ref 43.0–77.0)
Platelets: 158 10*3/uL (ref 150.0–400.0)
RBC: 5.02 Mil/uL (ref 4.22–5.81)
RDW: 15.1 % — ABNORMAL HIGH (ref 11.5–14.6)
WBC: 3 10*3/uL — ABNORMAL LOW (ref 4.5–10.5)

## 2013-11-02 LAB — BASIC METABOLIC PANEL
BUN: 15 mg/dL (ref 6–23)
CALCIUM: 9.2 mg/dL (ref 8.4–10.5)
CHLORIDE: 107 meq/L (ref 96–112)
CO2: 28 mEq/L (ref 19–32)
CREATININE: 0.8 mg/dL (ref 0.4–1.5)
GFR: 101.08 mL/min (ref >60.00)
Glucose, Bld: 118 mg/dL — ABNORMAL HIGH (ref 70–99)
Potassium: 5.4 mEq/L — ABNORMAL HIGH (ref 3.5–5.1)
SODIUM: 139 meq/L (ref 135–145)

## 2013-11-02 LAB — LIPID PANEL
CHOL/HDL RATIO: 4
Cholesterol: 169 mg/dL (ref 0–200)
HDL: 47 mg/dL (ref >39.00)
LDL Cholesterol: 103 mg/dL — ABNORMAL HIGH (ref 0–99)
TRIGLYCERIDES: 97 mg/dL (ref 0.0–149.0)
VLDL: 19.4 mg/dL (ref 0.0–40.0)

## 2013-11-02 LAB — HEMOGLOBIN A1C: Hgb A1c MFr Bld: 5.8 % (ref 4.6–6.5)

## 2013-11-06 ENCOUNTER — Encounter: Payer: Self-pay | Admitting: Family Medicine

## 2013-11-08 ENCOUNTER — Encounter: Payer: Self-pay | Admitting: Family Medicine

## 2013-11-16 ENCOUNTER — Ambulatory Visit: Payer: Medicare Other | Admitting: Family Medicine

## 2014-01-12 ENCOUNTER — Encounter: Payer: Self-pay | Admitting: Family Medicine

## 2014-02-15 ENCOUNTER — Other Ambulatory Visit: Payer: Self-pay | Admitting: Family Medicine

## 2014-02-17 ENCOUNTER — Encounter: Payer: Self-pay | Admitting: Family Medicine

## 2014-02-17 DIAGNOSIS — G2581 Restless legs syndrome: Secondary | ICD-10-CM

## 2014-02-18 MED ORDER — PRAMIPEXOLE DIHYDROCHLORIDE 0.25 MG PO TABS
ORAL_TABLET | ORAL | Status: DC
Start: 2014-02-18 — End: 2014-04-26

## 2014-02-18 MED ORDER — ATENOLOL 50 MG PO TABS: 50.0000 mg | ORAL_TABLET | Freq: Every day | ORAL | Status: DC

## 2014-04-19 ENCOUNTER — Other Ambulatory Visit (INDEPENDENT_AMBULATORY_CARE_PROVIDER_SITE_OTHER): Payer: Medicare HMO

## 2014-04-19 DIAGNOSIS — F528 Other sexual dysfunction not due to a substance or known physiological condition: Secondary | ICD-10-CM

## 2014-04-19 DIAGNOSIS — I10 Essential (primary) hypertension: Secondary | ICD-10-CM

## 2014-04-19 DIAGNOSIS — K219 Gastro-esophageal reflux disease without esophagitis: Secondary | ICD-10-CM

## 2014-04-19 DIAGNOSIS — R739 Hyperglycemia, unspecified: Secondary | ICD-10-CM

## 2014-04-19 DIAGNOSIS — M1990 Unspecified osteoarthritis, unspecified site: Secondary | ICD-10-CM

## 2014-04-19 DIAGNOSIS — Z125 Encounter for screening for malignant neoplasm of prostate: Secondary | ICD-10-CM

## 2014-04-19 DIAGNOSIS — R7309 Other abnormal glucose: Secondary | ICD-10-CM

## 2014-04-19 LAB — CBC WITH DIFFERENTIAL/PLATELET
BASOS ABS: 0.1 10*3/uL (ref 0.0–0.1)
Basophils Relative: 1 % (ref 0.0–3.0)
EOS ABS: 0.2 10*3/uL (ref 0.0–0.7)
Eosinophils Relative: 2.7 % (ref 0.0–5.0)
HCT: 42 % (ref 39.0–52.0)
Hemoglobin: 13 g/dL (ref 13.0–17.0)
LYMPHS PCT: 19.3 % (ref 12.0–46.0)
Lymphs Abs: 1.1 10*3/uL (ref 0.7–4.0)
MCHC: 30.9 g/dL (ref 30.0–36.0)
MCV: 72.7 fl — ABNORMAL LOW (ref 78.0–100.0)
Monocytes Absolute: 0.5 10*3/uL (ref 0.1–1.0)
Monocytes Relative: 8.3 % (ref 3.0–12.0)
NEUTROS PCT: 68.7 % (ref 43.0–77.0)
Neutro Abs: 4 10*3/uL (ref 1.4–7.7)
Platelets: 194 10*3/uL (ref 150.0–400.0)
RBC: 5.78 Mil/uL (ref 4.22–5.81)
RDW: 17.1 % — AB (ref 11.5–15.5)
WBC: 5.9 10*3/uL (ref 4.0–10.5)

## 2014-04-19 LAB — HEPATIC FUNCTION PANEL
ALBUMIN: 3.3 g/dL — AB (ref 3.5–5.2)
ALK PHOS: 94 U/L (ref 39–117)
ALT: 19 U/L (ref 0–53)
AST: 28 U/L (ref 0–37)
Bilirubin, Direct: 0.1 mg/dL (ref 0.0–0.3)
Total Bilirubin: 0.8 mg/dL (ref 0.2–1.2)
Total Protein: 6.8 g/dL (ref 6.0–8.3)

## 2014-04-19 LAB — LIPID PANEL
CHOLESTEROL: 174 mg/dL (ref 0–200)
HDL: 42.8 mg/dL (ref >39.00)
LDL CALC: 112 mg/dL — AB (ref 0–99)
NonHDL: 131.2
Total CHOL/HDL Ratio: 4
Triglycerides: 94 mg/dL (ref 0.0–149.0)
VLDL: 18.8 mg/dL (ref 0.0–40.0)

## 2014-04-19 LAB — BASIC METABOLIC PANEL
BUN: 20 mg/dL (ref 6–23)
CHLORIDE: 108 meq/L (ref 96–112)
CO2: 26 mEq/L (ref 19–32)
Calcium: 8.9 mg/dL (ref 8.4–10.5)
Creatinine, Ser: 0.9 mg/dL (ref 0.4–1.5)
GFR: 84.85 mL/min (ref >60.00)
GLUCOSE: 97 mg/dL (ref 70–99)
POTASSIUM: 4.5 meq/L (ref 3.5–5.1)
SODIUM: 142 meq/L (ref 135–145)

## 2014-04-19 LAB — TSH: TSH: 1.25 u[IU]/mL (ref 0.35–4.50)

## 2014-04-19 LAB — POCT URINALYSIS DIPSTICK
Bilirubin, UA: NEGATIVE
Blood, UA: NEGATIVE
Glucose, UA: NEGATIVE
LEUKOCYTES UA: NEGATIVE
Nitrite, UA: NEGATIVE
Spec Grav, UA: 1.02
UROBILINOGEN UA: 0.2
pH, UA: 6

## 2014-04-19 LAB — PSA: PSA: 3.84 ng/mL (ref 0.10–4.00)

## 2014-04-22 ENCOUNTER — Other Ambulatory Visit: Payer: Self-pay

## 2014-04-26 ENCOUNTER — Other Ambulatory Visit: Payer: Self-pay | Admitting: Family Medicine

## 2014-04-26 ENCOUNTER — Ambulatory Visit (INDEPENDENT_AMBULATORY_CARE_PROVIDER_SITE_OTHER): Payer: Medicare HMO | Admitting: Family Medicine

## 2014-04-26 ENCOUNTER — Encounter: Payer: Self-pay | Admitting: Family Medicine

## 2014-04-26 VITALS — BP 140/90 | Temp 98.4°F | Ht 71.0 in | Wt 224.0 lb

## 2014-04-26 DIAGNOSIS — IMO0002 Reserved for concepts with insufficient information to code with codable children: Secondary | ICD-10-CM

## 2014-04-26 DIAGNOSIS — M179 Osteoarthritis of knee, unspecified: Secondary | ICD-10-CM

## 2014-04-26 DIAGNOSIS — I10 Essential (primary) hypertension: Secondary | ICD-10-CM

## 2014-04-26 DIAGNOSIS — M459 Ankylosing spondylitis of unspecified sites in spine: Secondary | ICD-10-CM

## 2014-04-26 DIAGNOSIS — K219 Gastro-esophageal reflux disease without esophagitis: Secondary | ICD-10-CM

## 2014-04-26 DIAGNOSIS — M1710 Unilateral primary osteoarthritis, unspecified knee: Secondary | ICD-10-CM

## 2014-04-26 DIAGNOSIS — G2581 Restless legs syndrome: Secondary | ICD-10-CM

## 2014-04-26 DIAGNOSIS — E782 Mixed hyperlipidemia: Secondary | ICD-10-CM

## 2014-04-26 DIAGNOSIS — M169 Osteoarthritis of hip, unspecified: Secondary | ICD-10-CM

## 2014-04-26 DIAGNOSIS — Z23 Encounter for immunization: Secondary | ICD-10-CM

## 2014-04-26 MED ORDER — ATENOLOL 50 MG PO TABS: 50.0000 mg | ORAL_TABLET | Freq: Every day | ORAL | Status: DC

## 2014-04-26 MED ORDER — OMEPRAZOLE 20 MG PO CPDR: 20.0000 mg | DELAYED_RELEASE_CAPSULE | Freq: Every day | ORAL | Status: DC

## 2014-04-26 MED ORDER — INDOMETHACIN ER 75 MG PO CPCR: 75.0000 mg | ORAL_CAPSULE | Freq: Two times a day (BID) | ORAL | Status: DC

## 2014-04-26 MED ORDER — PRAVASTATIN SODIUM 20 MG PO TABS: 20.0000 mg | ORAL_TABLET | Freq: Every day | ORAL | Status: DC

## 2014-04-26 NOTE — Patient Instructions (Signed)
We will get you set up a consult to neurology for evaluation of restless leg syndrome  Continue your current medications  Followup in 1 year sooner if any problems

## 2014-04-26 NOTE — Progress Notes (Signed)
   Subjective:    Patient ID: Terry Rogers, male    DOB: 1941/01/30, 73 y.o.   MRN: [id number]  HPI Terry Rogers is a 73 year old married male nonsmoker who comes in today for evaluation of restless leg syndrome, reflux esophagitis, osteoarthritis,  His med this reviewed the been no changes. He's currently being followed in urology for hypergonadism and erectile dysfunction. He's been given AndroGel.  He's increased his Mirapex up to 1.25 mg however he still has the breasts leg syndrome. He's very frustrated with this and would like to see a specialist.  The rest of his med list reviewed and 10 unchanged  History can eye care, dental care, colonoscopy and GI,  Vaccinations updated by Apolonio Schneiders  He is retired plays bridge,,,,,,,,,, as over [employer] points,,,,,,,,,,, and golf   Review of Systems  Constitutional: Negative.   HENT: Negative.   Eyes: Negative.   Respiratory: Negative.   Cardiovascular: Negative.   Gastrointestinal: Negative.   Endocrine: Negative.   Genitourinary: Negative.   Musculoskeletal: Negative.   Skin: Negative.   Allergic/Immunologic: Negative.   Neurological: Negative.   Hematological: Negative.   Psychiatric/Behavioral: Negative.        Objective:   Physical Exam  Nursing note and vitals reviewed. Constitutional: He is oriented to person, place, and time. He appears well-developed and well-nourished.  HENT:  Head: Normocephalic and atraumatic.  Right Ear: External ear normal.  Left Ear: External ear normal.  Nose: Nose normal.  Mouth/Throat: Oropharynx is clear and moist.  Eyes: Conjunctivae and EOM are normal. Pupils are equal, round, and reactive to light.  Neck: Normal range of motion. Neck supple. No JVD present. No tracheal deviation present. No thyromegaly present.  Cardiovascular: Normal rate, regular rhythm, normal heart sounds and intact distal pulses.  Exam reveals no gallop and no friction rub.   No murmur heard. No carotid artery  bruits peripheral pulses 2+ and symmetrical  Pulmonary/Chest: Effort normal and breath sounds normal. No stridor. No respiratory distress. He has no wheezes. He has no rales. He exhibits no tenderness.  Abdominal: Soft. Bowel sounds are normal. He exhibits no distension and no mass. There is no tenderness. There is no rebound and no guarding.  Genitourinary:  Genital rectal exam done by urology therefore not repeated  Musculoskeletal: Normal range of motion. He exhibits no edema and no tenderness.  Lymphadenopathy:    He has no cervical adenopathy.  Neurological: He is alert and oriented to person, place, and time. He has normal reflexes. No cranial nerve deficit. He exhibits normal muscle tone.  Skin: Skin is warm and dry. No rash noted. No erythema. No pallor.  Psychiatric: He has a normal mood and affect. His behavior is normal. Judgment and thought content normal.          Assessment & Plan:  Healthy male  Hyperlipidemia continue Pravachol  Osteoarthritis continue indomethacin 75 mg once daily  Erectile dysfunction followed by urology  Restless leg syndrome refer to neurology for further consultation

## 2014-04-27 ENCOUNTER — Telehealth: Payer: Self-pay | Admitting: Family Medicine

## 2014-04-27 MED ORDER — TRIAMCINOLONE ACETONIDE 0.1 % EX CREA: TOPICAL_CREAM | CUTANEOUS | Status: AC

## 2014-04-27 NOTE — Telephone Encounter (Signed)
emmi emailed °

## 2014-04-29 ENCOUNTER — Encounter: Payer: Self-pay | Admitting: Family Medicine

## 2014-05-02 MED ORDER — LORAZEPAM 1 MG PO TABS: 1.0000 mg | ORAL_TABLET | Freq: Every evening | ORAL | Status: DC | PRN

## 2014-05-14 ENCOUNTER — Encounter: Payer: Self-pay | Admitting: Family Medicine

## 2014-05-17 ENCOUNTER — Telehealth: Payer: Self-pay | Admitting: Family Medicine

## 2014-05-17 NOTE — Telephone Encounter (Signed)
Message was sent through my chart. 

## 2014-05-17 NOTE — Telephone Encounter (Signed)
Pt is calling because he sent email thru mychart and waiting on a response

## 2014-05-18 ENCOUNTER — Encounter: Payer: Self-pay | Admitting: Family Medicine

## 2014-05-19 MED ORDER — EZETIMIBE 10 MG PO TABS: 10.0000 mg | ORAL_TABLET | Freq: Every day | ORAL | Status: DC

## 2014-05-23 ENCOUNTER — Encounter: Payer: Self-pay | Admitting: Neurology

## 2014-05-23 ENCOUNTER — Ambulatory Visit (INDEPENDENT_AMBULATORY_CARE_PROVIDER_SITE_OTHER): Payer: Medicare HMO | Admitting: Neurology

## 2014-05-23 VITALS — BP 126/68 | HR 70 | Temp 98.0°F | Resp 16 | Ht 71.0 in | Wt 226.6 lb

## 2014-05-23 DIAGNOSIS — G2581 Restless legs syndrome: Secondary | ICD-10-CM

## 2014-05-23 DIAGNOSIS — M79606 Pain in leg, unspecified: Secondary | ICD-10-CM

## 2014-05-23 DIAGNOSIS — G4761 Periodic limb movement disorder: Secondary | ICD-10-CM

## 2014-05-23 MED ORDER — ROPINIROLE HCL 0.25 MG PO TABS: 0.2500 mg | ORAL_TABLET | Freq: Every day | ORAL | Status: DC

## 2014-05-23 NOTE — Patient Instructions (Signed)
1.  Stop the Mirapex. 2.  Instead start ropinirole 0.25mg .  Take 2 hours before bedtime.  Call in one week with update and we can increase dose if needed. 3.  Will check for peripheral vascular disease 4.  Will check B12, folate, TSH, CK, Magnesium, iron and ferritin 5.  Follow up in 3 weeks.

## 2014-05-23 NOTE — Progress Notes (Signed)
NEUROLOGY CONSULTATION NOTE  INGVALD Rogers MRN: [id number] DOB: 1940-09-20  Referring provider: Dr. Sherren Mocha Primary care provider: Dr. Sherren Mocha  Reason for consult:  Restless leg syndrome  HISTORY OF PRESENT ILLNESS: Terry Rogers is a 73 year old right-handed man with hypertension, osteoarthritis of the hip, GERD, ankylosing spondylitis, hypergonadism, erectile dysfunction, and hyperlipidemia who presents for restless leg syndrome.  Records and labs reviewed.  He developed problems with leg discomfort about 6 months ago, around the time Zetia was switched to pravastatin.  He describes some discomfort in his thighs.  When he is in bed, either one of his legs will jerk involuntarily.  Laying on his left side is worse.  Moving his legs voluntarily does not relieve symptoms.  He denies numbness and tingling in the legs and feet.  He denies back pain or cramping in the legs when standing or walking.  He usually has to get out of bed and sleep in a bean chair. He averages only 5-6 hours of sleep a night.  He was given Ativan, which is not effective.  He was started on Mirapex.  He has taken 0.75mg  and 1.5mg , which have been ineffective.  He also complains of erectile dysfunction, which correlates with onset of this.  LABS: 04/19/14 WBC 5.9, HGB 13, HCT 42, PLT 194, Na 142, K 4.5, Cl 108, CO2 26, glucose 97, BUN 20, Cr 0.9, Calcium 8.9, TB 0.8, DB 0.1, ALP 94, AST 28, ALT 19, TP 6.8, Alb 3.3, TSH 1.25 11/02/13 HGB A1c 5.8  PAST MEDICAL HISTORY: Past Medical History  Diagnosis Date  . ED (erectile dysfunction)   . GERD (gastroesophageal reflux disease)   . Hypertension   . Osteoarthritis   . Hyperlipidemia   . Ulcer     PAST SURGICAL HISTORY: Past Surgical History  Procedure Laterality Date  . Tonsillectomy    . Rotator cuff repair  2009    bilateral   . Colonoscopy  12/28/2003    6 hyperplastic polyps [name])    MEDICATIONS: Current Outpatient Prescriptions on File Prior to Visit    Medication Sig Dispense Refill  . Alprostadil (PROSTAGLANDIN E1) POWD by Does not apply route.    Marland Kitchen atenolol (TENORMIN) 50 MG tablet Take 1 tablet (50 mg total) by mouth daily. 90 tablet 3  . ezetimibe (ZETIA) 10 MG tablet Take 1 tablet (10 mg total) by mouth daily. 90 tablet 3  . hydrocortisone (ANUSOL-HC) 25 MG suppository PLACE 1 SUPPOSITORY (25 MG TOTAL) RECTALLY 2 (TWO) TIMES DAILY. 12 suppository 0  . indomethacin (INDOCIN SR) 75 MG CR capsule Take 1 capsule (75 mg total) by mouth 2 (two) times daily with a meal. 200 capsule 3  . LORazepam (ATIVAN) 1 MG tablet Take 1 tablet (1 mg total) by mouth at bedtime as needed for anxiety. 30 tablet 0  . omeprazole (PRILOSEC) 20 MG capsule Take 1 capsule (20 mg total) by mouth daily. 100 capsule 3  . pramipexole (MIRAPEX) 0.75 MG tablet Take 0.75 mg by mouth daily.    . pravastatin (PRAVACHOL) 20 MG tablet Take 1 tablet (20 mg total) by mouth daily. 90 tablet 3  . triamcinolone cream (KENALOG) 0.1 % APPLY 1 APPLICATION        TOPICALLY TWO TIMES A DAY 454 g 3  . tretinoin (RETIN-A) 0.01 % gel Apply topically at bedtime. 45 g 3   Current Facility-Administered Medications on File Prior to Visit  Medication Dose Route Frequency Provider Last Rate Last Dose  . 0.9 %  sodium chloride infusion  500 mL Intravenous Continuous Gatha Mayer, MD        ALLERGIES: Allergies  Allergen Reactions  . Sulfonamide Derivatives     FAMILY HISTORY: Family History  Problem Relation Age of Onset  . Stroke Mother   . Alzheimer's disease Father   . Heart failure Maternal Grandmother   . Heart failure Maternal Grandfather   . Alzheimer's disease Paternal Grandmother     SOCIAL HISTORY: History   Social History  . Marital Status: Married    Spouse Name: N/A    Number of Children: 3  . Years of Education: N/A   Occupational History  . retired    Social History Main Topics  . Smoking status: Former Smoker -- 1.50 packs/day for 50 years    Types:  Cigarettes    Quit date: 10/22/2006  . Smokeless tobacco: Never Used     Comment: Pt uses Nicorette Gum currently  . Alcohol Use: 1.2 oz/week    2 Glasses of wine per week     Comment: a day   . Drug Use: No  . Sexual Activity:    Partners: Female   Other Topics Concern  . Not on file   Social History Narrative    REVIEW OF SYSTEMS: Constitutional: No fevers, chills, or sweats, no generalized fatigue, change in appetite Eyes: No visual changes, double vision, eye pain Ear, nose and throat: No hearing loss, ear pain, nasal congestion, sore throat Cardiovascular: No chest pain, palpitations Respiratory:  No shortness of breath at rest or with exertion, wheezes GastrointestinaI: No nausea, vomiting, diarrhea, abdominal pain, fecal incontinence Genitourinary:  No dysuria, urinary retention or frequency.  Erectile dysfunction. Musculoskeletal:  No neck pain, back pain Integumentary: No rash, pruritus, skin lesions Neurological: as above Psychiatric: No depression, insomnia, anxiety Endocrine: No palpitations, fatigue, diaphoresis, mood swings, change in appetite, change in weight, increased thirst Hematologic/Lymphatic:  No anemia, purpura, petechiae. Allergic/Immunologic: no itchy/runny eyes, nasal congestion, recent allergic reactions, rashes  PHYSICAL EXAM: Filed Vitals:   05/23/14 1532  BP: 126/68  Pulse: 70  Temp: 98 F (36.7 C)  Resp: 16   General: No acute distress Head:  Normocephalic/atraumatic Eyes:  fundi unremarkable, without vessel changes, exudates, hemorrhages or papilledema. CN III, IV, VI:  full range of motion, no nystagmus, no ptosis Neck: supple, no paraspinal tenderness, full range of motion Back: No paraspinal tenderness Heart: regular rate and rhythm Lungs: Clear to auscultation bilaterally. Vascular: No carotid bruits. Neurological Exam: Mental status: alert and oriented to person, place, and time, recent and remote memory intact, fund of  knowledge intact, attention and concentration intact, speech fluent and not dysarthric, language intact. Cranial nerves: CN I: not tested CN II: pupils equal, round and reactive to light, visual fields intact, fundi unremarkable, without vessel changes, exudates, hemorrhages or papilledema. CN III, IV, VI:  full range of motion, no nystagmus, no ptosis CN V: facial sensation intact CN VII: upper and lower face symmetric CN VIII: hearing intact CN IX, X: gag intact, uvula midline CN XI: sternocleidomastoid and trapezius muscles intact CN XII: tongue midline Bulk & Tone: normal, no fasciculations. Motor:  5/5 throughout Sensation:  Temperature and vibration intact Deep Tendon Reflexes:  2+ throughout, toes downgoing Finger to nose testing:  No dysmetra Gait:  Normal station and stride.  Able to turn and walk in tandem. Romberg negative.  IMPRESSION: Restless leg versus periodic limb movements.  It is not a classic presentation.  PLAN: 1.  Will  stop Mirapex and start ropinirole 0.25mg  at bedtime.  Will increase every week as needed. 2.  Will check B12, folate, iron, ferritin, CK, Mg. 3.  Will check ABIs 4.  Follow up next month  Thank you for allowing me to take part in the care of this patient.  Metta Clines, DO  CC:  Stevie Kern, MD

## 2014-05-24 LAB — VITAMIN B12: Vitamin B-12: 498 pg/mL (ref 211–911)

## 2014-05-24 LAB — FERRITIN: Ferritin: 11 ng/mL — ABNORMAL LOW (ref 22–322)

## 2014-05-24 LAB — MAGNESIUM: MAGNESIUM: 2 mg/dL (ref 1.5–2.5)

## 2014-05-24 LAB — CK: Total CK: 101 U/L (ref 7–232)

## 2014-05-24 LAB — IRON: IRON: 22 ug/dL — AB (ref 42–165)

## 2014-05-24 LAB — TSH: TSH: 1.216 u[IU]/mL (ref 0.350–4.500)

## 2014-05-25 ENCOUNTER — Encounter: Payer: Self-pay | Admitting: Family Medicine

## 2014-05-25 ENCOUNTER — Telehealth: Payer: Self-pay | Admitting: *Deleted

## 2014-05-25 NOTE — Telephone Encounter (Signed)
-----   Message from Dudley Major, DO sent at 05/25/2014  7:44 AM EST ----- Labs show that Mr. Hillyard is iron-deficient.  This may be contributing to his leg symptoms.  I recommend that he start iron supplementation as managed by Dr. Sherren Mocha.  I will forward this message to Dr. Sherren Mocha, as well. ----- Message -----    From: [facility] Interface    Sent: 05/24/2014   1:32 AM      To: Dudley Major, DO

## 2014-05-25 NOTE — Telephone Encounter (Signed)
Left message for pateint to return call to affice about lab results

## 2014-05-27 ENCOUNTER — Encounter: Payer: Self-pay | Admitting: Neurology

## 2014-05-27 MED ORDER — ZOLPIDEM TARTRATE 5 MG PO TABS: 5.0000 mg | ORAL_TABLET | Freq: Every evening | ORAL | Status: DC | PRN

## 2014-05-30 ENCOUNTER — Encounter: Payer: Self-pay | Admitting: Neurology

## 2014-05-30 ENCOUNTER — Ambulatory Visit (HOSPITAL_COMMUNITY)
Admission: RE | Admit: 2014-05-30 | Discharge: 2014-05-30 | Disposition: A | Discharge status: Home / Self Care | Payer: Medicare HMO | Source: Ambulatory Visit | Attending: Surgery | Admitting: Surgery

## 2014-05-30 ENCOUNTER — Other Ambulatory Visit: Payer: Self-pay | Admitting: Neurology

## 2014-05-30 ENCOUNTER — Encounter: Payer: Self-pay | Admitting: Family Medicine

## 2014-05-30 DIAGNOSIS — G2581 Restless legs syndrome: Secondary | ICD-10-CM

## 2014-05-30 DIAGNOSIS — M79606 Pain in leg, unspecified: Secondary | ICD-10-CM | POA: Insufficient documentation

## 2014-06-04 ENCOUNTER — Emergency Department (HOSPITAL_COMMUNITY)
Admission: EM | Admit: 2014-06-04 | Discharge: 2014-06-04 | Disposition: A | Discharge status: Home / Self Care | Payer: Medicare HMO | Attending: Emergency Medicine | Admitting: Emergency Medicine

## 2014-06-04 ENCOUNTER — Encounter (HOSPITAL_COMMUNITY): Payer: Self-pay | Admitting: Emergency Medicine

## 2014-06-04 ENCOUNTER — Encounter: Payer: Self-pay | Admitting: Neurology

## 2014-06-04 DIAGNOSIS — Z8739 Personal history of other diseases of the musculoskeletal system and connective tissue: Secondary | ICD-10-CM | POA: Diagnosis not present

## 2014-06-04 DIAGNOSIS — Z872 Personal history of diseases of the skin and subcutaneous tissue: Secondary | ICD-10-CM | POA: Insufficient documentation

## 2014-06-04 DIAGNOSIS — N4839 Other priapism: Secondary | ICD-10-CM | POA: Diagnosis not present

## 2014-06-04 DIAGNOSIS — Z87891 Personal history of nicotine dependence: Secondary | ICD-10-CM | POA: Insufficient documentation

## 2014-06-04 DIAGNOSIS — Z7952 Long term (current) use of systemic steroids: Secondary | ICD-10-CM | POA: Diagnosis not present

## 2014-06-04 DIAGNOSIS — Z79899 Other long term (current) drug therapy: Secondary | ICD-10-CM | POA: Diagnosis not present

## 2014-06-04 DIAGNOSIS — E785 Hyperlipidemia, unspecified: Secondary | ICD-10-CM | POA: Diagnosis not present

## 2014-06-04 DIAGNOSIS — N4889 Other specified disorders of penis: Secondary | ICD-10-CM | POA: Diagnosis present

## 2014-06-04 DIAGNOSIS — N4830 Priapism, unspecified: Secondary | ICD-10-CM

## 2014-06-04 DIAGNOSIS — K219 Gastro-esophageal reflux disease without esophagitis: Secondary | ICD-10-CM | POA: Insufficient documentation

## 2014-06-04 DIAGNOSIS — I10 Essential (primary) hypertension: Secondary | ICD-10-CM | POA: Insufficient documentation

## 2014-06-04 MED ORDER — PHENYLEPHRINE 200 MCG/ML FOR PRIAPISM / HYPOTENSION
500.0000 ug | INTRAMUSCULAR | Status: DC | PRN
  Administered 2014-06-04: 500 ug via INTRACAVERNOUS
  Filled 2014-06-04: qty 50

## 2014-06-04 MED ORDER — TERBUTALINE SULFATE 1 MG/ML IJ SOLN
0.2500 mg | Freq: Once | INTRAMUSCULAR | Status: AC
  Administered 2014-06-04: 0.25 mg via SUBCUTANEOUS
  Filled 2014-06-04: qty 1

## 2014-06-04 NOTE — ED Notes (Signed)
Pt presents with an erection x 5 hours. Pt states he has recently changed medication and feels it may be too strong.

## 2014-06-04 NOTE — ED Notes (Signed)
Bed: WA17 Expected date:  Expected time:  Means of arrival:  Comments: Tr 1

## 2014-06-04 NOTE — Discharge Instructions (Signed)
Avoid using the injectible erection medication until you consult with your Urologist  Return if the erection returns.

## 2014-06-04 NOTE — ED Provider Notes (Signed)
CSN: [id number]     Arrival date & time 06/04/14  0227 History   First MD Initiated Contact with Patient 06/04/14 0253     Chief Complaint  Patient presents with  . Male GU Problem     (Consider location/radiation/quality/duration/timing/severity/associated sxs/prior Treatment) HPI Comments: The pt has had a new injective ED drug that he started tonight - has had ongoing erection lasting > 4 hours this evening.  This is constant, painful and not associated with any bleeding or dysuria.  Told by Urologist to come to the ED for evaluation.  Patient is a 73 y.o. male presenting with male genitourinary complaint. The history is provided by the patient.  Male GU Problem Presenting symptoms: penile pain   Associated symptoms: penile swelling   Associated symptoms: no fever     Past Medical History  Diagnosis Date  . ED (erectile dysfunction)   . GERD (gastroesophageal reflux disease)   . Hypertension   . Osteoarthritis   . Hyperlipidemia   . Ulcer    Past Surgical History  Procedure Laterality Date  . Tonsillectomy    . Rotator cuff repair  2009    bilateral   . Colonoscopy  12/28/2003    6 hyperplastic polyps [name])   Family History  Problem Relation Age of Onset  . Stroke Mother   . Alzheimer's disease Father   . Heart failure Maternal Grandmother   . Heart failure Maternal Grandfather   . Alzheimer's disease Paternal Grandmother    History  Substance Use Topics  . Smoking status: Former Smoker -- 1.50 packs/day for 50 years    Types: Cigarettes    Quit date: 10/22/2006  . Smokeless tobacco: Never Used     Comment: Pt uses Nicorette Gum currently  . Alcohol Use: 1.2 oz/week    2 Glasses of wine per week     Comment: a day     Review of Systems  Constitutional: Negative for fever.  Genitourinary: Positive for penile swelling and penile pain.      Allergies  Sulfonamide derivatives  Home Medications   Prior to Admission medications   Medication Sig  Start Date End Date Taking? Authorizing Provider  atenolol (TENORMIN) 50 MG tablet Take 1 tablet (50 mg total) by mouth daily. 04/26/14 04/26/15 Yes Dorena Cookey, MD  ezetimibe (ZETIA) 10 MG tablet Take 1 tablet (10 mg total) by mouth daily. 05/19/14  Yes Dorena Cookey, MD  indomethacin (INDOCIN SR) 75 MG CR capsule Take 1 capsule (75 mg total) by mouth 2 (two) times daily with a meal. 04/26/14  Yes Dorena Cookey, MD  omeprazole (PRILOSEC) 20 MG capsule Take 1 capsule (20 mg total) by mouth daily. 04/26/14  Yes Dorena Cookey, MD  pramipexole (MIRAPEX) 0.75 MG tablet Take 0.75 mg by mouth daily.   Yes Historical Provider, MD  rOPINIRole (REQUIP) 0.25 MG tablet Take 1 tablet (0.25 mg total) by mouth at bedtime. 05/23/14  Yes Adam Melvern Sample, DO  triamcinolone cream (KENALOG) 0.1 % APPLY 1 APPLICATION        TOPICALLY TWO TIMES A DAY 04/27/14  Yes Dorena Cookey, MD  Alprostadil (PROSTAGLANDIN E1) POWD by Does not apply route.    Historical Provider, MD  hydrocortisone (ANUSOL-HC) 25 MG suppository PLACE 1 SUPPOSITORY (25 MG TOTAL) RECTALLY 2 (TWO) TIMES DAILY. 05/31/13   Dorena Cookey, MD  pravastatin (PRAVACHOL) 20 MG tablet Take 1 tablet (20 mg total) by mouth daily. Patient not taking: Reported on 06/04/2014  04/26/14   Dorena Cookey, MD  tretinoin (RETIN-A) 0.01 % gel Apply topically at bedtime. Patient not taking: Reported on 06/04/2014 08/26/13   Dorena Cookey, MD  zolpidem (AMBIEN) 5 MG tablet Take 1 tablet (5 mg total) by mouth at bedtime as needed for sleep. Patient not taking: Reported on 06/04/2014 05/27/14   Dorena Cookey, MD   BP 167/96 mmHg  Pulse 69  Temp(Src) 97.9 F (36.6 C) (Oral)  Resp 18  Ht 5\' 11"  (1.803 m)  Wt 220 lb (99.791 kg)  BMI 30.70 kg/m2  SpO2 98% Physical Exam  Constitutional: He appears well-developed and well-nourished.  HENT:  Head: Normocephalic and atraumatic.  Eyes: Conjunctivae are normal. Right eye exhibits no discharge. Left eye exhibits no  discharge.  Pulmonary/Chest: Effort normal. No respiratory distress.  Genitourinary:  Erection present, no bruising bleeding or urethral d/c.  Neurological: He is alert. Coordination normal.  Skin: Skin is warm and dry. No rash noted. He is not diaphoretic. No erythema.  Psychiatric: He has a normal mood and affect.  Nursing note and vitals reviewed.   ED Course  Procedures (including critical care time) Labs Review Labs Reviewed - No data to display  Imaging Review No results found.    MDM   Final diagnoses:  Priapism    Will attempt terbutaline, if not phenylepherine.  Terbutaline with no effect, injectable phenylephrine diluted in normal saline and approximately 1 mL was injected in each side of the penis. This had no relief, there was a small amount of leakage of blood and bruising, on second attempt approximately 30 mL of blood was drawn off of the penis with additional 2 mL total medication reinjected which was unsuccessful with abortion of priapism.   Meds given in ED:  Medications  phenylephrine 200 mcg / ml CONC. DILUTION INJ (ED / Urology USE ONLY) (not administered)  terbutaline (BRETHINE) injection 0.25 mg (0.25 mg Subcutaneous Given 06/04/14 0317)      Johnna Acosta, MD 06/04/14 [phone]

## 2014-06-06 ENCOUNTER — Encounter: Payer: Self-pay | Admitting: Neurology

## 2014-06-08 ENCOUNTER — Other Ambulatory Visit: Payer: Self-pay | Admitting: Neurology

## 2014-06-09 ENCOUNTER — Telehealth: Payer: Self-pay | Admitting: *Deleted

## 2014-06-09 NOTE — Telephone Encounter (Signed)
rOPINIRole (REQUIP) .50mg  #30 1 PO HS with 2 refills  Called to [facility] pharmacy

## 2014-06-09 NOTE — Telephone Encounter (Signed)
Left message with patient that medication Requip has been called into [facility] voice machine picked up

## 2014-06-09 NOTE — Telephone Encounter (Signed)
Patient calling in reference to a RX needing to be sent to the pharmacy Call back number [phone]

## 2014-06-10 ENCOUNTER — Ambulatory Visit: Payer: Medicare HMO | Admitting: Neurology

## 2014-06-13 ENCOUNTER — Encounter: Payer: Self-pay | Admitting: Neurology

## 2014-06-16 ENCOUNTER — Encounter: Payer: Self-pay | Admitting: Family Medicine

## 2014-06-16 ENCOUNTER — Encounter: Payer: Self-pay | Admitting: Neurology

## 2014-06-16 ENCOUNTER — Ambulatory Visit (INDEPENDENT_AMBULATORY_CARE_PROVIDER_SITE_OTHER): Payer: Medicare HMO | Admitting: Neurology

## 2014-06-16 VITALS — BP 136/74 | HR 74 | Temp 98.4°F | Resp 16 | Ht 71.5 in | Wt 227.6 lb

## 2014-06-16 DIAGNOSIS — E611 Iron deficiency: Secondary | ICD-10-CM

## 2014-06-16 DIAGNOSIS — G2581 Restless legs syndrome: Secondary | ICD-10-CM

## 2014-06-16 NOTE — Patient Instructions (Signed)
Increase ropinirole to 1mg  at bedtime (take 1 to 2 hours before) Send my My Chart message with update or questions Follow up in 2-3 months.

## 2014-06-16 NOTE — Progress Notes (Signed)
NEUROLOGY FOLLOW UP OFFICE NOTE  JAXSUN CIAMPI [id number]  HISTORY OF PRESENT ILLNESS: Terry Rogers is a 73 year old right-handed man with hypertension, osteoarthritis of the hip, GERD, ankylosing spondylitis, hypergonadism, erectile dysfunction, and hyperlipidemia who follows up for restless leg syndrome.  Records and labs reviewed.  UPDATE: Labs from 05/23/14 showed ferritin level of 11 and iron of 22.  He started over the counter iron 50mg  daily.  Other labs included Mg 2, TSH 1.216, CK 101, and B12 498.  He had ABIs performed, which was normal.  He was started on ropinirole and currently is taking 0.5mg  at bedtime. He also started over the counter iron supplementation.  He has noticed improvement in his restless leg symptoms.  He went from getting 2 hours of sleep nightly to 7 hours a night.  He will wake up once during the night.  He will then read for 15-20 minutes and then fall back to sleep.   HISTORY: He developed problems with leg discomfort about 6 months ago, around the time Zetia was switched to pravastatin.  He describes some discomfort in his thighs.  When he is in bed, either one of his legs will jerk involuntarily.  Laying on his left side is worse.  Moving his legs voluntarily does not relieve symptoms.  He denies numbness and tingling in the legs and feet.  He denies back pain or cramping in the legs when standing or walking.  He usually has to get out of bed and sleep in a bean chair. He averages only 5-6 hours of sleep a night.  He was given Ativan, which is not effective.  He was started on Mirapex.  He has taken 0.75mg  and 1.5mg , which have been ineffective.  He also complains of erectile dysfunction, which correlates with onset of this.  LABS: 04/19/14 WBC 5.9, HGB 13, HCT 42, PLT 194, Na 142, K 4.5, Cl 108, CO2 26, glucose 97, BUN 20, Cr 0.9, Calcium 8.9, TB 0.8, DB 0.1, ALP 94, AST 28, ALT 19, TP 6.8, Alb 3.3, TSH 1.25 11/02/13 HGB A1c 5.8  PAST MEDICAL HISTORY: Past  Medical History  Diagnosis Date  . ED (erectile dysfunction)   . GERD (gastroesophageal reflux disease)   . Hypertension   . Osteoarthritis   . Hyperlipidemia   . Ulcer     MEDICATIONS: Current Outpatient Prescriptions on File Prior to Visit  Medication Sig Dispense Refill  . Alprostadil (PROSTAGLANDIN E1) POWD by Does not apply route.    Marland Kitchen atenolol (TENORMIN) 50 MG tablet Take 1 tablet (50 mg total) by mouth daily. 90 tablet 3  . ezetimibe (ZETIA) 10 MG tablet Take 1 tablet (10 mg total) by mouth daily. 90 tablet 3  . hydrocortisone (ANUSOL-HC) 25 MG suppository PLACE 1 SUPPOSITORY (25 MG TOTAL) RECTALLY 2 (TWO) TIMES DAILY. 12 suppository 0  . indomethacin (INDOCIN SR) 75 MG CR capsule Take 1 capsule (75 mg total) by mouth 2 (two) times daily with a meal. 200 capsule 3  . omeprazole (PRILOSEC) 20 MG capsule Take 1 capsule (20 mg total) by mouth daily. 100 capsule 3  . pramipexole (MIRAPEX) 0.75 MG tablet Take 0.75 mg by mouth daily.    . pravastatin (PRAVACHOL) 20 MG tablet Take 1 tablet (20 mg total) by mouth daily. 90 tablet 3  . rOPINIRole (REQUIP) 0.25 MG tablet Take 1 tablet (0.25 mg total) by mouth at bedtime. 30 tablet 0  . tretinoin (RETIN-A) 0.01 % gel Apply topically at bedtime. [place]  g 3  . triamcinolone cream (KENALOG) 0.1 % APPLY 1 APPLICATION        TOPICALLY TWO TIMES A DAY 454 g 3  . zolpidem (AMBIEN) 5 MG tablet Take 1 tablet (5 mg total) by mouth at bedtime as needed for sleep. 30 tablet 5   Current Facility-Administered Medications on File Prior to Visit  Medication Dose Route Frequency Provider Last Rate Last Dose  . 0.9 %  sodium chloride infusion  500 mL Intravenous Continuous Gatha Mayer, MD        ALLERGIES: Allergies  Allergen Reactions  . Sulfonamide Derivatives     FAMILY HISTORY: Family History  Problem Relation Age of Onset  . Stroke Mother   . Alzheimer's disease Father   . Heart failure Maternal Grandmother   . Heart failure Maternal  Grandfather   . Alzheimer's disease Paternal Grandmother     SOCIAL HISTORY: History   Social History  . Marital Status: Married    Spouse Name: N/A    Number of Children: 3  . Years of Education: N/A   Occupational History  . retired    Social History Main Topics  . Smoking status: Former Smoker -- 1.50 packs/day for 50 years    Types: Cigarettes    Quit date: 10/22/2006  . Smokeless tobacco: Never Used     Comment: Pt uses Nicorette Gum currently  . Alcohol Use: 1.2 oz/week    2 Glasses of wine per week     Comment: a day   . Drug Use: No  . Sexual Activity:    Partners: Female   Other Topics Concern  . Not on file   Social History Narrative    REVIEW OF SYSTEMS: Constitutional: No fevers, chills, or sweats, no generalized fatigue, change in appetite Eyes: No visual changes, double vision, eye pain Ear, nose and throat: No hearing loss, ear pain, nasal congestion, sore throat Cardiovascular: No chest pain, palpitations Respiratory:  No shortness of breath at rest or with exertion, wheezes GastrointestinaI: No nausea, vomiting, diarrhea, abdominal pain, fecal incontinence Genitourinary:  No dysuria, urinary retention or frequency Musculoskeletal:  No neck pain, back pain Integumentary: No rash, pruritus, skin lesions Neurological: as above Psychiatric: No depression, insomnia, anxiety Endocrine: No palpitations, fatigue, diaphoresis, mood swings, change in appetite, change in weight, increased thirst Hematologic/Lymphatic:  No anemia, purpura, petechiae. Allergic/Immunologic: no itchy/runny eyes, nasal congestion, recent allergic reactions, rashes  PHYSICAL EXAM: Filed Vitals:   06/16/14 1053  BP: 136/74  Pulse: 74  Temp: 98.4 F (36.9 C)  Resp: 16   General: No acute distress Head:  Normocephalic/atraumatic  IMPRESSION: Restless leg syndrome.  It is improved. Iron deficiency  PLAN: Increase dose of ropinirole to 1mg  1-2 hours before  bedtime Continue iron supplementation (discuss appropriate dose with Dr. Sherren Mocha) Follow up in 3 months  15 minutes spent with patient, 100% spent discussing etiology and management.  Metta Clines, DO  CC:  Stevie Kern, MD

## 2014-06-24 ENCOUNTER — Encounter: Payer: Self-pay | Admitting: Neurology

## 2014-06-27 ENCOUNTER — Other Ambulatory Visit: Payer: Self-pay | Admitting: Neurology

## 2014-06-27 MED ORDER — ROPINIROLE HCL 0.5 MG PO TABS: 1.0000 mg | ORAL_TABLET | Freq: Every day | ORAL | Status: DC

## 2014-07-08 HISTORY — PX: COLONOSCOPY: SHX174

## 2014-07-11 ENCOUNTER — Encounter: Payer: Self-pay | Admitting: Neurology

## 2014-07-15 ENCOUNTER — Encounter: Payer: Self-pay | Admitting: Neurology

## 2014-07-15 ENCOUNTER — Other Ambulatory Visit: Payer: Self-pay | Admitting: *Deleted

## 2014-07-15 MED ORDER — ROPINIROLE HCL 0.5 MG PO TABS: 1.0000 mg | ORAL_TABLET | Freq: Every day | ORAL | Status: DC

## 2014-08-01 LAB — HM COLONOSCOPY

## 2014-08-03 ENCOUNTER — Encounter: Payer: Self-pay | Admitting: Family Medicine

## 2014-11-16 ENCOUNTER — Other Ambulatory Visit: Payer: Self-pay | Admitting: *Deleted

## 2014-11-16 MED ORDER — EZETIMIBE 10 MG PO TABS: 10.0000 mg | ORAL_TABLET | Freq: Every day | ORAL | Status: DC

## 2015-04-07 ENCOUNTER — Telehealth: Payer: Self-pay | Admitting: Family Medicine

## 2015-04-07 MED ORDER — EZETIMIBE 10 MG PO TABS: 10.0000 mg | ORAL_TABLET | Freq: Every day | ORAL | Status: DC

## 2015-04-07 NOTE — Telephone Encounter (Signed)
Terry Rogers, Terry Rogers [phone]   Requesting refill of ezetimibe (ZETIA) 10 MG tablet

## 2015-04-07 NOTE — Telephone Encounter (Signed)
rx sent

## 2015-04-19 ENCOUNTER — Ambulatory Visit (INDEPENDENT_AMBULATORY_CARE_PROVIDER_SITE_OTHER): Payer: Medicare HMO | Admitting: Adult Health

## 2015-04-19 ENCOUNTER — Encounter: Payer: Self-pay | Admitting: Adult Health

## 2015-04-19 VITALS — BP 110/80 | Temp 98.3°F | Ht 71.5 in | Wt 232.9 lb

## 2015-04-19 DIAGNOSIS — M459 Ankylosing spondylitis of unspecified sites in spine: Secondary | ICD-10-CM | POA: Diagnosis not present

## 2015-04-19 DIAGNOSIS — Z23 Encounter for immunization: Secondary | ICD-10-CM | POA: Diagnosis not present

## 2015-04-19 DIAGNOSIS — I10 Essential (primary) hypertension: Secondary | ICD-10-CM

## 2015-04-19 DIAGNOSIS — F528 Other sexual dysfunction not due to a substance or known physiological condition: Secondary | ICD-10-CM | POA: Diagnosis not present

## 2015-04-19 MED ORDER — MELOXICAM 7.5 MG PO TABS: 7.5000 mg | ORAL_TABLET | Freq: Every day | ORAL | Status: DC

## 2015-04-19 NOTE — Patient Instructions (Signed)
It was great meeting you today!  I have sent in a prescription for  Mobic , start off with one pill daily. If that does not work you can take two pills daily.   Follow up with me for a physical. If you need anything in the meantime, please let me know.

## 2015-04-19 NOTE — Progress Notes (Signed)
HPI:  Terry Rogers is here to establish care.  Last PCP and physical:04/2014 with MD Sherren Mocha  Has the following chronic problems that require follow up and concerns today:  Erectile Dysfunction - Has been seeing Urology for testosterone replacement. He would rather not go see Urology anymore, states " I don't need to keep going to see these specialists."   Spondylitis - Pain in his back and neck. Has used prescription NSAIDS in the past but he gets diarrhea from the them after using them for an extended period of time. Does not have pain when he takes the medications.   Essential Hypertension - feels as though he is controlled on current medication.     ROS negative for unless reported above: fevers, chills,feeling poorly, unintentional weight loss, hearing or vision loss, chest pain, palpitations, leg claudication, struggling to breath,Not feeling congested in the chest, no orthopenia, no cough,no wheezing, normal appetite, no soft tissue swelling, no hemoptysis, melena, hematochezia, hematuria, falls, loc, si, or thoughts of self harm.  Immunizations: UTD Diet:"Terrible"  Exercise: Plays golf three times a week (walks), walks every morning.  Colonoscopy:2026. Has had polyps in the past  Rheumatology: Does not follow up with them on a regular basis.  Urology- For testosterone replacement.   Past Medical History  Diagnosis Date  . ED (erectile dysfunction)   . GERD (gastroesophageal reflux disease)   . Hypertension   . Osteoarthritis   . Hyperlipidemia   . Ulcer     Past Surgical History  Procedure Laterality Date  . Tonsillectomy    . Rotator cuff repair  2009    bilateral   . Colonoscopy  12/28/2003    6 hyperplastic polyps [name])    Family History  Problem Relation Age of Onset  . Stroke Mother   . Alzheimer's disease Father   . Heart failure Maternal Grandmother   . Heart failure Maternal Grandfather   . Alzheimer's disease Paternal Grandmother      Social History   Social History  . Marital Status: Married    Spouse Name: N/A  . Number of Children: 3  . Years of Education: N/A   Occupational History  . retired    Social History Main Topics  . Smoking status: Former Smoker -- 1.50 packs/day for 50 years    Types: Cigarettes    Quit date: 10/22/2006  . Smokeless tobacco: Never Used     Comment: Pt uses Nicorette Gum currently  . Alcohol Use: 1.2 oz/week    2 Glasses of wine per week     Comment: a day   . Drug Use: No  . Sexual Activity:    Partners: Female   Other Topics Concern  . None   Social History Narrative     Current outpatient prescriptions:  .  atenolol (TENORMIN) 50 MG tablet, Take 1 tablet (50 mg total) by mouth daily., Disp: 90 tablet, Rfl: 3 .  B-D 3CC LUER-LOK SYR 21GX1" 21G X 1" 3 ML MISC, See admin instructions., Disp: , Rfl: 3 .  ezetimibe (ZETIA) 10 MG tablet, Take 1 tablet (10 mg total) by mouth daily., Disp: 90 tablet, Rfl: 0 .  ferrous sulfate 325 (65 FE) MG tablet, Take 325 mg by mouth daily with breakfast., Disp: , Rfl:  .  indomethacin (INDOCIN SR) 75 MG CR capsule, Take 1 capsule (75 mg total) by mouth 2 (two) times daily with a meal., Disp: 200 capsule, Rfl: 3 .  Multiple Vitamins-Minerals (CENTRUM SILVER ULTRA MENS  PO), Take by mouth., Disp: , Rfl:  .  naproxen (NAPROSYN) 500 MG tablet, , Disp: , Rfl:  .  omeprazole (PRILOSEC) 20 MG capsule, Take 1 capsule (20 mg total) by mouth daily., Disp: 100 capsule, Rfl: 3 .  pramipexole (MIRAPEX) 0.75 MG tablet, Take 0.75 mg by mouth daily., Disp: , Rfl:  .  rOPINIRole (REQUIP) 0.5 MG tablet, Take 2 tablets (1 mg total) by mouth at bedtime., Disp: 180 tablet, Rfl: 2 .  testosterone cypionate (DEPOTESTOSTERONE CYPIONATE) 200 MG/ML injection, Inject 200 mg into the muscle every 28 (twenty-eight) days., Disp: , Rfl:  .  triamcinolone cream (KENALOG) 0.1 %, APPLY 1 APPLICATION        TOPICALLY TWO TIMES A DAY, Disp: 454 g, Rfl: 3 .  hydrocortisone  (ANUSOL-HC) 25 MG suppository, PLACE 1 SUPPOSITORY (25 MG TOTAL) RECTALLY 2 (TWO) TIMES DAILY. (Patient not taking: Reported on 04/19/2015), Disp: 12 suppository, Rfl: 0  Current facility-administered medications:  .  0.9 %  sodium chloride infusion, 500 mL, Intravenous, Continuous, Gatha Mayer, MD  EXAM:  Filed Vitals:   04/19/15 1414  BP: 110/80  Temp: 98.3 F (36.8 C)    Body mass index is 32.03 kg/(m^2).  GENERAL: vitals reviewed and listed above, alert, oriented, appears well hydrated and in no acute distress. Obese in abdomen.Flat affect with monotone voice.   HEENT: atraumatic, conjunttiva clear, no obvious abnormalities on inspection of external nose and ears  NECK: Neck is soft and supple without masses, no adenopathy or thyromegaly, trachea midline, no JVD. Normal range of motion.   LUNGS: clear to auscultation bilaterally, no wheezes, rales or rhonchi, good air movement  CV: Regular rate and rhythm, normal S1/S2, no audible murmurs, gallops, or rubs. No carotid bruit and no peripheral edema.   MS: moves all extremities without noticeable abnormality. No edema noted  Abd: soft/nontender/nondistended/normal bowel sounds   Skin: warm and dry, no rash   Extremities: No clubbing, cyanosis, or edema. Capillary refill is WNL. Pulses intact bilaterally in upper and lower extremities.   Neuro: CN II-XII intact, sensation and reflexes normal throughout, 5/5 muscle strength in bilateral upper and lower extremities. Normal finger to nose. Normal rapid alternating movements.   PSYCH: pleasant and cooperative, no obvious depression or anxiety  ASSESSMENT AND PLAN:  1. Essential hypertension - Controlled on current medication.  -BP: 110/80 mmHg  - Consider decreasing medication  2. ERECTILE DYSFUNCTION, MILD - He needs to follow up with Urology so that his testosterone levels can be monitored appropriatly  3. SPONDYLITIS, ANKYLOSING - Discontinue Naproxen and Indocin   - meloxicam (MOBIC) 7.5 MG tablet; Take 1 tablet (7.5 mg total) by mouth daily.  Dispense: 90 tablet; Refill: 2 - Ideally I would like him to follow up with his Rheumatologist   -We reviewed the PMH, PSH, FH, SH, Meds and Allergies. -We provided refills for any medications we will prescribe as needed. -We addressed current concerns per orders and patient instructions. -We have asked for records for pertinent exams, studies, vaccines and notes from previous providers. -We have advised patient to follow up per instructions below.   -Patient advised to return or notify a provider immediately if symptoms worsen or persist or new concerns arise.  There are no Patient Instructions on file for this visit.   Dorothyann Peng, AGNP

## 2015-04-19 NOTE — Progress Notes (Signed)
Pre visit review using our clinic review tool, if applicable. No additional management support is needed unless otherwise documented below in the visit note. 

## 2015-04-25 ENCOUNTER — Other Ambulatory Visit (INDEPENDENT_AMBULATORY_CARE_PROVIDER_SITE_OTHER): Payer: Medicare HMO

## 2015-04-25 DIAGNOSIS — I10 Essential (primary) hypertension: Secondary | ICD-10-CM

## 2015-04-25 DIAGNOSIS — Z0000 Encounter for general adult medical examination without abnormal findings: Secondary | ICD-10-CM

## 2015-04-25 DIAGNOSIS — E785 Hyperlipidemia, unspecified: Secondary | ICD-10-CM

## 2015-04-25 LAB — POCT URINALYSIS DIPSTICK
Bilirubin, UA: NEGATIVE
Glucose, UA: NEGATIVE
Ketones, UA: NEGATIVE
Leukocytes, UA: NEGATIVE
NITRITE UA: NEGATIVE
RBC UA: NEGATIVE
SPEC GRAV UA: 1.025
UROBILINOGEN UA: 0.2
pH, UA: 5.5

## 2015-04-25 LAB — CBC WITH DIFFERENTIAL/PLATELET
BASOS ABS: 0 10*3/uL (ref 0.0–0.1)
Basophils Relative: 0.4 % (ref 0.0–3.0)
EOS ABS: 0.1 10*3/uL (ref 0.0–0.7)
Eosinophils Relative: 2.3 % (ref 0.0–5.0)
HEMATOCRIT: 51.1 % (ref 39.0–52.0)
HEMOGLOBIN: 17 g/dL (ref 13.0–17.0)
LYMPHS ABS: 1.1 10*3/uL (ref 0.7–4.0)
LYMPHS PCT: 21 % (ref 12.0–46.0)
MCHC: 33.3 g/dL (ref 30.0–36.0)
MCV: 86.4 fl (ref 78.0–100.0)
MONOS PCT: 9.5 % (ref 3.0–12.0)
Monocytes Absolute: 0.5 10*3/uL (ref 0.1–1.0)
Neutro Abs: 3.5 10*3/uL (ref 1.4–7.7)
Neutrophils Relative %: 66.8 % (ref 43.0–77.0)
Platelets: 156 10*3/uL (ref 150.0–400.0)
RBC: 5.91 Mil/uL — AB (ref 4.22–5.81)
RDW: 15.1 % (ref 11.5–15.5)
WBC: 5.3 10*3/uL (ref 4.0–10.5)

## 2015-04-25 LAB — TSH: TSH: 1.2 u[IU]/mL (ref 0.35–4.50)

## 2015-04-25 LAB — LIPID PANEL
CHOLESTEROL: 191 mg/dL (ref 0–200)
HDL: 44.6 mg/dL (ref >39.00)
LDL CALC: 113 mg/dL — AB (ref 0–99)
NonHDL: 146.39
Total CHOL/HDL Ratio: 4
Triglycerides: 166 mg/dL — ABNORMAL HIGH (ref 0.0–149.0)
VLDL: 33.2 mg/dL (ref 0.0–40.0)

## 2015-04-25 LAB — TESTOSTERONE: TESTOSTERONE: 293.85 ng/dL — AB (ref 300.00–890.00)

## 2015-04-25 LAB — BASIC METABOLIC PANEL
BUN: 17 mg/dL (ref 6–23)
CALCIUM: 9.3 mg/dL (ref 8.4–10.5)
CO2: 32 mEq/L (ref 19–32)
CREATININE: 0.98 mg/dL (ref 0.40–1.50)
Chloride: 104 mEq/L (ref 96–112)
GFR: 79.65 mL/min (ref >60.00)
Glucose, Bld: 114 mg/dL — ABNORMAL HIGH (ref 70–99)
Potassium: 5.3 mEq/L — ABNORMAL HIGH (ref 3.5–5.1)
SODIUM: 141 meq/L (ref 135–145)

## 2015-04-25 LAB — HEPATIC FUNCTION PANEL
ALT: 26 U/L (ref 0–53)
AST: 26 U/L (ref 0–37)
Albumin: 3.9 g/dL (ref 3.5–5.2)
Alkaline Phosphatase: 92 U/L (ref 39–117)
BILIRUBIN TOTAL: 0.7 mg/dL (ref 0.2–1.2)
Bilirubin, Direct: 0.1 mg/dL (ref 0.0–0.3)
TOTAL PROTEIN: 6.6 g/dL (ref 6.0–8.3)

## 2015-04-25 LAB — PSA: PSA: 3.47 ng/mL (ref 0.10–4.00)

## 2015-04-25 LAB — IRON: Iron: 143 ug/dL (ref 42–165)

## 2015-04-28 ENCOUNTER — Other Ambulatory Visit: Payer: Self-pay | Admitting: Adult Health

## 2015-04-28 ENCOUNTER — Encounter: Payer: Self-pay | Admitting: Adult Health

## 2015-05-01 ENCOUNTER — Encounter: Payer: Medicare HMO | Admitting: Adult Health

## 2015-05-02 ENCOUNTER — Encounter: Payer: Medicare HMO | Admitting: Adult Health

## 2015-06-14 ENCOUNTER — Encounter: Payer: Self-pay | Admitting: Adult Health

## 2015-06-14 ENCOUNTER — Ambulatory Visit (INDEPENDENT_AMBULATORY_CARE_PROVIDER_SITE_OTHER): Payer: Medicare HMO | Admitting: Adult Health

## 2015-06-14 VITALS — BP 130/84 | HR 86 | Temp 98.3°F | Ht 71.0 in | Wt 234.0 lb

## 2015-06-14 DIAGNOSIS — Z0000 Encounter for general adult medical examination without abnormal findings: Secondary | ICD-10-CM

## 2015-06-14 DIAGNOSIS — Z23 Encounter for immunization: Secondary | ICD-10-CM

## 2015-06-14 DIAGNOSIS — I10 Essential (primary) hypertension: Secondary | ICD-10-CM | POA: Diagnosis not present

## 2015-06-14 DIAGNOSIS — J0120 Acute ethmoidal sinusitis, unspecified: Secondary | ICD-10-CM

## 2015-06-14 DIAGNOSIS — Z125 Encounter for screening for malignant neoplasm of prostate: Secondary | ICD-10-CM

## 2015-06-14 DIAGNOSIS — R197 Diarrhea, unspecified: Secondary | ICD-10-CM

## 2015-06-14 MED ORDER — MINOCYCLINE HCL 100 MG PO CAPS: 100.0000 mg | ORAL_CAPSULE | Freq: Two times a day (BID) | ORAL | Status: DC

## 2015-06-14 NOTE — Patient Instructions (Signed)
It was great seeing you again!  I have sent in a prescription for Doxycyline, this is an antibiotic that will help with the sinus pain and pressure. Take it twice a day for 7 days.   Let me know what the name of the medication is for diarrhea. Work on [employer].   Follow up with me as needed.

## 2015-06-14 NOTE — Progress Notes (Signed)
Pre visit review using our clinic review tool, if applicable. No additional management support is needed unless otherwise documented below in the visit note. 

## 2015-06-14 NOTE — Progress Notes (Signed)
Subjective:    Patient ID: Terry Rogers, male    DOB: [birth year], 74 y.o.   MRN: [id number]  HPI  Patient presents for yearly preventative medicine examination.  All immunizations and health maintenance protocols were reviewed with the patient and needed orders were placed.  Appropriate screening laboratory values were reviewed for the patient including screening of hyperlipidemia, renal function and hepatic function.  Medication reconciliation,  past medical history, social history, problem list and allergies were reviewed in detail with the patient  Goals were established with regard to weight loss, exercise, and  diet in compliance with medications  End of life planning was discussed.- he has a living will and advanced directive.   He is receiving testosterone every two weeks.   He continues to have chronic diarrhea. He has stopped taking Indocin and Mobic in the hopes that this would help. He is currently taking Advil and has not noticed a change in diarrhea. He does not eat well and does not want to change his diet. He denies any abdominal pain or blood in stool.   He also has a complaint of two weeks of sinus pain and pressure with frontal headache. He endorses having rhinorrhea but no PND. He denies cough, fever, or feeling congested in the chest.    Review of Systems  Constitutional: Negative.   HENT: Positive for rhinorrhea, sinus pressure and sore throat. Negative for ear discharge and ear pain.   Eyes: Negative.   Respiratory: Negative.   Cardiovascular: Negative.   Gastrointestinal: Positive for diarrhea. Negative for nausea, vomiting, abdominal pain, constipation, blood in stool, abdominal distention, anal bleeding and rectal pain.  Endocrine: Negative.   Genitourinary: Negative.   Musculoskeletal: Negative.   Skin: Negative.   Allergic/Immunologic: Negative.   Neurological: Negative.   Hematological: Negative.   Psychiatric/Behavioral: Negative.   All other  systems reviewed and are negative.  Past Medical History  Diagnosis Date  . ED (erectile dysfunction)   . GERD (gastroesophageal reflux disease)   . Hypertension   . Osteoarthritis   . Hyperlipidemia   . Ulcer     Social History   Social History  . Marital Status: Married    Spouse Name: N/A  . Number of Children: 3  . Years of Education: N/A   Occupational History  . retired    Social History Main Topics  . Smoking status: Former Smoker -- 1.50 packs/day for 50 years    Types: Cigarettes    Quit date: 10/22/2006  . Smokeless tobacco: Never Used     Comment: Pt uses Nicorette Gum currently  . Alcohol Use: 1.2 oz/week    2 Glasses of wine per week     Comment: a day   . Drug Use: No  . Sexual Activity:    Partners: Female   Other Topics Concern  . Not on file   Social History Narrative   Retired from [employer]   Married    Three daughters, all live in [place]   He likes to golf and play bridge.     Past Surgical History  Procedure Laterality Date  . Tonsillectomy    . Rotator cuff repair  2009    bilateral   . Colonoscopy  12/28/2003    6 hyperplastic polyps [name])    Family History  Problem Relation Age of Onset  . Stroke Mother   . Alzheimer's disease Father   . Heart failure Maternal Grandmother   . Heart failure Maternal Grandfather   .  Alzheimer's disease Paternal Grandmother     Allergies  Allergen Reactions  . Sulfonamide Derivatives     Current Outpatient Prescriptions on File Prior to Visit  Medication Sig Dispense Refill  . B-D 3CC LUER-LOK SYR 21GX1" 21G X 1" 3 ML MISC See admin instructions.  3  . ezetimibe (ZETIA) 10 MG tablet Take 1 tablet (10 mg total) by mouth daily. 90 tablet 0  . ferrous sulfate 325 (65 FE) MG tablet Take 325 mg by mouth daily with breakfast.    . hydrocortisone (ANUSOL-HC) 25 MG suppository PLACE 1 SUPPOSITORY (25 MG TOTAL) RECTALLY 2 (TWO) TIMES DAILY. 12 suppository 0  . meloxicam (MOBIC) 7.5  MG tablet Take 1 tablet (7.5 mg total) by mouth daily. 90 tablet 2  . Multiple Vitamins-Minerals (CENTRUM SILVER ULTRA MENS PO) Take by mouth.    Marland Kitchen omeprazole (PRILOSEC) 20 MG capsule Take 1 capsule (20 mg total) by mouth daily. 100 capsule 3  . pramipexole (MIRAPEX) 0.75 MG tablet Take 0.75 mg by mouth daily.    Marland Kitchen rOPINIRole (REQUIP) 0.5 MG tablet Take 2 tablets (1 mg total) by mouth at bedtime. 180 tablet 2  . testosterone cypionate (DEPOTESTOSTERONE CYPIONATE) 200 MG/ML injection Inject 200 mg into the muscle every 28 (twenty-eight) days.    Marland Kitchen triamcinolone cream (KENALOG) 0.1 % APPLY 1 APPLICATION        TOPICALLY TWO TIMES A DAY 454 g 3  . atenolol (TENORMIN) 50 MG tablet Take 1 tablet (50 mg total) by mouth daily. 90 tablet 3   No current facility-administered medications on file prior to visit.    BP 130/84 mmHg  Pulse 86  Temp(Src) 98.3 F (36.8 C) (Oral)  Ht 5\' 11"  (1.803 m)  Wt 234 lb (106.142 kg)  BMI 32.65 kg/m2       Objective:   Physical Exam  Constitutional: He is oriented to person, place, and time. He appears well-developed and well-nourished. No distress.  Slightly obese  HENT:  Head: Normocephalic and atraumatic.  Right Ear: External ear normal.  Left Ear: External ear normal.  Nose: Nose normal.  Mouth/Throat: Oropharynx is clear and moist. No oropharyngeal exudate.  Eyes: Conjunctivae and EOM are normal. Pupils are equal, round, and reactive to light. Right eye exhibits no discharge. Left eye exhibits no discharge.  Neck: Normal range of motion. Neck supple. No JVD present. No tracheal deviation present. No thyromegaly present.  Cardiovascular: Normal rate, regular rhythm, normal heart sounds and intact distal pulses.  Exam reveals no gallop and no friction rub.   No murmur heard. Pulmonary/Chest: Effort normal and breath sounds normal. No respiratory distress. He has no wheezes. He has no rales. He exhibits no tenderness.  Abdominal: Soft. Bowel sounds are  normal. He exhibits no distension and no mass. There is no tenderness. There is no rebound and no guarding.  Genitourinary: Rectum normal, prostate normal and penis normal. Guaiac negative stool. No penile tenderness.  Musculoskeletal: Normal range of motion. He exhibits no edema or tenderness.  Lymphadenopathy:    He has no cervical adenopathy.  Neurological: He is alert and oriented to person, place, and time.  Skin: Skin is warm and dry. No rash noted. He is not diaphoretic. No erythema. No pallor.  Psychiatric: He has a normal mood and affect. His behavior is normal. Judgment and thought content normal.  Nursing note and vitals reviewed.     Assessment & Plan:  1. Essential hypertension - EKG 12-Lead- NSR, rate 83  2. Routine general  medical examination at a health care facility - Follow up in one year for MWE - Follow up sooner if needed - Tdap vaccine greater than or equal to 7yo IM  3. Acute ethmoidal sinusitis, recurrence not specified - minocycline (MINOCIN,DYNACIN) 100 MG capsule; Take 1 capsule (100 mg total) by mouth 2 (two) times daily.  Dispense: 14 capsule; Refill: 0  4. Diarrhea, unspecified type - Encouraged changing diet - Encouraged to have stool cultures done - he refused.  - Does not want to see GI at this time.  - He has a medication that his doctor in [place] prescribed for him that he endorses works well. He will call back and let me know the name of it.  - Consider GI referral.

## 2015-06-28 ENCOUNTER — Other Ambulatory Visit: Payer: Self-pay | Admitting: Family Medicine

## 2015-06-28 MED ORDER — EZETIMIBE 10 MG PO TABS
10.0000 mg | ORAL_TABLET | Freq: Every day | ORAL | Status: DC
Start: 2015-06-28 — End: 2016-02-29

## 2015-06-28 NOTE — Addendum Note (Signed)
Addended by: Colleen Can on: 06/28/2015 10:22 AM   Modules accepted: Orders

## 2015-06-28 NOTE — Telephone Encounter (Signed)
Rx sent to pharmacy   

## 2015-08-06 ENCOUNTER — Encounter: Payer: Self-pay | Admitting: Adult Health

## 2015-08-07 ENCOUNTER — Encounter: Payer: Self-pay | Admitting: Adult Health

## 2015-09-17 ENCOUNTER — Encounter: Payer: Self-pay | Admitting: Adult Health

## 2015-09-19 MED ORDER — ROPINIROLE HCL 0.5 MG PO TABS: 1.0000 mg | ORAL_TABLET | Freq: Three times a day (TID) | ORAL | Status: DC

## 2015-09-19 NOTE — Telephone Encounter (Signed)
Rx sent to mail order

## 2015-11-13 ENCOUNTER — Encounter: Payer: Self-pay | Admitting: Adult Health

## 2015-11-14 ENCOUNTER — Other Ambulatory Visit: Payer: Self-pay | Admitting: Adult Health

## 2015-11-14 MED ORDER — PREDNISONE 10 MG PO TABS: 10.0000 mg | ORAL_TABLET | Freq: Every day | ORAL | Status: DC

## 2015-12-11 ENCOUNTER — Other Ambulatory Visit: Payer: Self-pay | Admitting: Family Medicine

## 2015-12-11 ENCOUNTER — Encounter: Payer: Self-pay | Admitting: Adult Health

## 2015-12-13 ENCOUNTER — Ambulatory Visit (INDEPENDENT_AMBULATORY_CARE_PROVIDER_SITE_OTHER): Payer: Medicare HMO | Admitting: Adult Health

## 2015-12-13 ENCOUNTER — Encounter: Payer: Self-pay | Admitting: Adult Health

## 2015-12-13 DIAGNOSIS — L259 Unspecified contact dermatitis, unspecified cause: Secondary | ICD-10-CM

## 2015-12-13 MED ORDER — METHYLPREDNISOLONE ACETATE 80 MG/ML IJ SUSP
120.0000 mg | Freq: Once | INTRAMUSCULAR | Status: AC
  Administered 2015-12-13: 120 mg via INTRAMUSCULAR

## 2015-12-13 NOTE — Patient Instructions (Addendum)
Great seeing you again.   You were given an injection of depo medrol. This should take care of the poison ivy.   Follow up if no improvement   Poison [facility] ivy is a inflammation of the skin (contact dermatitis) caused by touching the allergens on the leaves of the ivy plant following previous exposure to the plant. The rash usually appears 48 hours after exposure. The rash is usually bumps (papules) or blisters (vesicles) in a linear pattern. Depending on your own sensitivity, the rash may simply cause redness and itching, or it may also progress to blisters which may break open. These must be well cared for to prevent secondary bacterial (germ) infection, followed by scarring. Keep any open areas dry, clean, dressed, and covered with an antibacterial ointment if needed. The eyes may also get puffy. The puffiness is worst in the morning and gets better as the day progresses. This dermatitis usually heals without scarring, within 2 to 3 weeks without treatment. HOME CARE INSTRUCTIONS  Thoroughly wash with soap and water as soon as you have been exposed to poison ivy. You have about one half hour to remove the plant resin before it will cause the rash. This washing will destroy the oil or antigen on the skin that is causing, or will cause, the rash. Be sure to wash under your fingernails as any plant resin there will continue to spread the rash. Do not rub skin vigorously when washing affected area. Poison ivy cannot spread if no oil from the plant remains on your body. A rash that has progressed to weeping sores will not spread the rash unless you have not washed thoroughly. It is also important to wash any clothes you have been wearing as these may carry active allergens. The rash will return if you wear the unwashed clothing, even several days later. Avoidance of the plant in the future is the best measure. Poison ivy plant can be recognized by the number of leaves. Generally, poison ivy has three  leaves with flowering branches on a single stem. Diphenhydramine may be purchased over the counter and used as needed for itching. Do not drive with this medication if it makes you drowsy.Ask your caregiver about medication for children. SEEK MEDICAL CARE IF:  Open sores develop.  Redness spreads beyond area of rash.  You notice purulent (pus-like) discharge.  You have increased pain.  Other signs of infection develop (such as fever).   This information is not intended to replace advice given to you by your health care provider. Make sure you discuss any questions you have with your health care provider.   Document Released: 06/21/2000 Document Revised: 09/16/2011 Document Reviewed: 11/30/2014 Elsevier Interactive Patient Education [employer].

## 2015-12-13 NOTE — Progress Notes (Signed)
Subjective:    Patient ID: Terry Rogers, male    DOB: 1940/08/09, 75 y.o.   MRN: [id number]  Rash This is a new problem. The current episode started in the past 7 days. The problem has been gradually worsening since onset. The affected locations include the groin, left arm, right arm, left upper leg and right lowerleg. The rash is characterized by blistering, redness and itchiness. He was exposed to plant contact. Pertinent negatives include no congestion, facial edema, fatigue, fever, joint pain, rhinorrhea or shortness of breath. Past treatments include anti-itch cream. The treatment provided mild relief. There is no history of allergies, asthma, eczema or varicella.      Review of Systems  Constitutional: Negative for fever and fatigue.  HENT: Negative for congestion and rhinorrhea.   Respiratory: Negative for shortness of breath.   Musculoskeletal: Negative for joint pain.  Skin: Positive for color change and rash. Negative for wound.   Past Medical History  Diagnosis Date  . ED (erectile dysfunction)   . GERD (gastroesophageal reflux disease)   . Hypertension   . Osteoarthritis   . Hyperlipidemia   . Ulcer   . Kidney stone     Social History   Social History  . Marital Status: Married    Spouse Name: N/A  . Number of Children: 3  . Years of Education: N/A   Occupational History  . retired    Social History Main Topics  . Smoking status: Former Smoker -- 1.50 packs/day for 50 years    Types: Cigarettes    Quit date: 10/22/2006  . Smokeless tobacco: Never Used     Comment: Pt uses Nicorette Gum currently  . Alcohol Use: 1.2 oz/week    2 Glasses of wine per week     Comment: a day   . Drug Use: No  . Sexual Activity:    Partners: Female   Other Topics Concern  . Not on file   Social History Narrative   Retired from [employer]   Married    Three daughters, all live in [place]   He likes to golf and play bridge.     Past Surgical History    Procedure Laterality Date  . Tonsillectomy    . Rotator cuff repair  2009    bilateral   . Colonoscopy  12/28/2003    6 hyperplastic polyps [name])    Family History  Problem Relation Age of Onset  . Stroke Mother   . Alzheimer's disease Father   . Heart failure Maternal Grandmother   . Heart failure Maternal Grandfather   . Alzheimer's disease Paternal Grandmother     Allergies  Allergen Reactions  . Sulfonamide Derivatives     Current Outpatient Prescriptions on File Prior to Visit  Medication Sig Dispense Refill  . B-D 3CC LUER-LOK SYR 21GX1" 21G X 1" 3 ML MISC See admin instructions.  3  . ezetimibe (ZETIA) 10 MG tablet Take 1 tablet (10 mg total) by mouth daily. 90 tablet 3  . ferrous sulfate 325 (65 FE) MG tablet Take 325 mg by mouth daily with breakfast.    . hydrocortisone (ANUSOL-HC) 25 MG suppository PLACE 1 SUPPOSITORY (25 MG TOTAL) RECTALLY 2 (TWO) TIMES DAILY. 12 suppository 0  . Multiple Vitamins-Minerals (CENTRUM SILVER ULTRA MENS PO) Take by mouth.    Marland Kitchen omeprazole (PRILOSEC) 20 MG capsule Take 1 capsule (20 mg total) by mouth daily. 100 capsule 3  . rOPINIRole (REQUIP) 0.5 MG tablet Take 2 tablets (  1 mg total) by mouth 3 (three) times daily. 180 tablet 2  . testosterone cypionate (DEPOTESTOSTERONE CYPIONATE) 200 MG/ML injection Inject 200 mg into the muscle every 28 (twenty-eight) days.    Marland Kitchen triamcinolone cream (KENALOG) 0.1 % APPLY 1 APPLICATION        TOPICALLY TWO TIMES A DAY 454 g 3  . ZETIA 10 MG tablet TAKE 1 TABLET DAILY 90 tablet 1  . atenolol (TENORMIN) 50 MG tablet Take 1 tablet (50 mg total) by mouth daily. 90 tablet 3   No current facility-administered medications on file prior to visit.    BP 124/80 mmHg  Temp(Src) 98.1 F (36.7 C) (Oral)  Ht 5\' 11"  (1.803 m)  Wt 218 lb 9.6 oz (99.156 kg)  BMI 30.50 kg/m2       Objective:   Physical Exam  Constitutional: He is oriented to person, place, and time. He appears well-developed and  well-nourished. No distress.  Cardiovascular: Normal rate, regular rhythm, normal heart sounds and intact distal pulses.  Exam reveals no gallop and no friction rub.   No murmur heard. Pulmonary/Chest: Effort normal and breath sounds normal. No respiratory distress. He has no wheezes. He has no rales. He exhibits no tenderness.  Neurological: He is alert and oriented to person, place, and time.  Skin: Skin is warm and dry. Rash noted. Rash is vesicular. He is not diaphoretic. There is erythema. No pallor.     Psychiatric: He has a normal mood and affect. His behavior is normal. Judgment and thought content normal.  Nursing note and vitals reviewed.      Assessment & Plan:  1. Contact dermatitis - Appears as a rash from poison ivy. No signs of infection - methylPREDNISolone acetate (DEPO-MEDROL) injection 120 mg; Inject 1.5 mLs (120 mg total) into the muscle once. - Continue with OTC anti itch cream - Follow up if no improvement   Dorothyann Peng, NP

## 2016-02-08 ENCOUNTER — Encounter: Payer: Self-pay | Admitting: Adult Health

## 2016-02-08 ENCOUNTER — Ambulatory Visit (INDEPENDENT_AMBULATORY_CARE_PROVIDER_SITE_OTHER): Payer: Medicare HMO | Admitting: Adult Health

## 2016-02-08 VITALS — BP 110/64 | Temp 98.6°F | Ht 71.0 in | Wt 222.0 lb

## 2016-02-08 DIAGNOSIS — Z5189 Encounter for other specified aftercare: Secondary | ICD-10-CM | POA: Diagnosis not present

## 2016-02-08 NOTE — Progress Notes (Signed)
Subjective:    Patient ID: Terry Rogers, male    DOB: 12/15/1940, 75 y.o.   MRN: [id number]  HPI  75 year old male who presents to the office today for wound check. He sustained a wound on his right forearm approx 2 weeks ago. Per patient " I walked into a piece of metal that was hanging in my garage". He has been placing Vaseline and a bandage on the wound. He feels as though it is not healing as well as he would like. He would like to make sure that his wound is not infected.   He denies any drainage, warmth, redness, or pain    Review of Systems  Constitutional: Negative.   Skin: Positive for wound.  All other systems reviewed and are negative.  Past Medical History:  Diagnosis Date  . ED (erectile dysfunction)   . GERD (gastroesophageal reflux disease)   . Hyperlipidemia   . Hypertension   . Kidney stone   . Osteoarthritis   . Ulcer     Social History   Social History  . Marital status: Married    Spouse name: N/A  . Number of children: 3  . Years of education: N/A   Occupational History  . retired    Social History Main Topics  . Smoking status: Former Smoker    Packs/day: 1.50    Years: 50.00    Types: Cigarettes    Quit date: 10/22/2006  . Smokeless tobacco: Never Used     Comment: Pt uses Nicorette Gum currently  . Alcohol use 1.2 oz/week    2 Glasses of wine per week     Comment: a day   . Drug use: No  . Sexual activity: Yes    Partners: Female   Other Topics Concern  . Not on file   Social History Narrative   Retired from [employer]   Married    Three daughters, all live in [place]   He likes to golf and play bridge.     Past Surgical History:  Procedure Laterality Date  . COLONOSCOPY  12/28/2003   6 hyperplastic polyps [name])  . ROTATOR CUFF REPAIR  2009   bilateral   . TONSILLECTOMY      Family History  Problem Relation Age of Onset  . Stroke Mother   . Alzheimer's disease Father   . Heart failure Maternal  Grandmother   . Heart failure Maternal Grandfather   . Alzheimer's disease Paternal Grandmother     Allergies  Allergen Reactions  . Sulfonamide Derivatives     Current Outpatient Prescriptions on File Prior to Visit  Medication Sig Dispense Refill  . B-D 3CC LUER-LOK SYR 21GX1" 21G X 1" 3 ML MISC See admin instructions.  3  . ezetimibe (ZETIA) 10 MG tablet Take 1 tablet (10 mg total) by mouth daily. 90 tablet 3  . ferrous sulfate 325 (65 FE) MG tablet Take 325 mg by mouth daily with breakfast.    . hydrocortisone (ANUSOL-HC) 25 MG suppository PLACE 1 SUPPOSITORY (25 MG TOTAL) RECTALLY 2 (TWO) TIMES DAILY. 12 suppository 0  . Multiple Vitamins-Minerals (CENTRUM SILVER ULTRA MENS PO) Take by mouth.    Marland Kitchen omeprazole (PRILOSEC) 20 MG capsule Take 1 capsule (20 mg total) by mouth daily. 100 capsule 3  . rOPINIRole (REQUIP) 0.5 MG tablet Take 2 tablets (1 mg total) by mouth 3 (three) times daily. 180 tablet 2  . testosterone cypionate (DEPOTESTOSTERONE CYPIONATE) 200 MG/ML injection Inject 200 mg  into the muscle every 28 (twenty-eight) days.    Marland Kitchen triamcinolone cream (KENALOG) 0.1 % APPLY 1 APPLICATION        TOPICALLY TWO TIMES A DAY 454 g 3  . ZETIA 10 MG tablet TAKE 1 TABLET DAILY 90 tablet 1  . atenolol (TENORMIN) 50 MG tablet Take 1 tablet (50 mg total) by mouth daily. 90 tablet 3   No current facility-administered medications on file prior to visit.     BP 110/64   Temp 98.6 F (37 C) (Oral)   Ht 5\' 11"  (1.803 m)   Wt 222 lb (100.7 kg)   BMI 30.96 kg/m       Objective:   Physical Exam  Constitutional: He is oriented to person, place, and time. He appears well-developed and well-nourished. No distress.  Neurological: He is alert and oriented to person, place, and time. He has normal reflexes.  Skin: Skin is warm and dry. He is not diaphoretic.  Approx 3.5 inch x 4 cm wound to right forearm. No signs of infection.   Psychiatric: He has a normal mood and affect. His behavior is  normal. Judgment and thought content normal.  Nursing note and vitals reviewed.     Assessment & Plan:  1. Visit for wound check - Does not appear infected - Advised antibiotic ointment  - Continue to cover with clean bandage daily.  - Follow up with any signs of infection   Dorothyann Peng, NP

## 2016-02-29 ENCOUNTER — Encounter: Payer: Self-pay | Admitting: Family Medicine

## 2016-02-29 ENCOUNTER — Ambulatory Visit (INDEPENDENT_AMBULATORY_CARE_PROVIDER_SITE_OTHER): Payer: Medicare HMO | Admitting: Family Medicine

## 2016-02-29 VITALS — BP 134/90 | HR 73 | Ht 70.5 in | Wt 221.1 lb

## 2016-02-29 DIAGNOSIS — D509 Iron deficiency anemia, unspecified: Secondary | ICD-10-CM | POA: Insufficient documentation

## 2016-02-29 DIAGNOSIS — F528 Other sexual dysfunction not due to a substance or known physiological condition: Secondary | ICD-10-CM

## 2016-02-29 DIAGNOSIS — R739 Hyperglycemia, unspecified: Secondary | ICD-10-CM

## 2016-02-29 DIAGNOSIS — I10 Essential (primary) hypertension: Secondary | ICD-10-CM

## 2016-02-29 DIAGNOSIS — E669 Obesity, unspecified: Secondary | ICD-10-CM | POA: Insufficient documentation

## 2016-02-29 DIAGNOSIS — E291 Testicular hypofunction: Secondary | ICD-10-CM | POA: Insufficient documentation

## 2016-02-29 DIAGNOSIS — G479 Sleep disorder, unspecified: Secondary | ICD-10-CM

## 2016-02-29 DIAGNOSIS — R7309 Other abnormal glucose: Secondary | ICD-10-CM

## 2016-02-29 DIAGNOSIS — G2581 Restless legs syndrome: Secondary | ICD-10-CM

## 2016-02-29 DIAGNOSIS — Z87891 Personal history of nicotine dependence: Secondary | ICD-10-CM | POA: Insufficient documentation

## 2016-02-29 DIAGNOSIS — E782 Mixed hyperlipidemia: Secondary | ICD-10-CM | POA: Diagnosis not present

## 2016-02-29 MED ORDER — NIACIN ER (ANTIHYPERLIPIDEMIC) 1000 MG PO TBCR: 1000.0000 mg | EXTENDED_RELEASE_TABLET | Freq: Every day | ORAL | 0 refills | Status: DC

## 2016-02-29 MED ORDER — LOSARTAN POTASSIUM 50 MG PO TABS: 50.0000 mg | ORAL_TABLET | Freq: Every day | ORAL | 0 refills | Status: DC

## 2016-02-29 NOTE — Assessment & Plan Note (Signed)
This was patient's primary concern today.  Patient seen urology Dr. Jeralyn Ruths for this. He has a history of getting penile injections and was sent to the emergency room for priapism due to that. He is frustrated and wondering how he can improve this today.  I explained his beta blocker blood pressure medicine can be contributing significantly to his ED and we started losartan on him and discontinued his atenolol.  Told to check his blood pressures at home, write it down, bring in next office visit

## 2016-02-29 NOTE — Assessment & Plan Note (Signed)
Explained that weight loss would help with erectile dysfunction, blood pressure, cholesterol, osteoarthritis etc.

## 2016-02-29 NOTE — Assessment & Plan Note (Signed)
Counseling done regarding his heavy usage of cigarettes for the years.  Advise prudent diet-handouts provided-and regular exercise to help mitigate risks of atherosclerotic cardiovascular disease

## 2016-02-29 NOTE — Assessment & Plan Note (Addendum)
He was given Ativan in the past, which is not effective. He was started on Mirapex afterwrds- he has taken 0.75mg  and 1.5mg , which have been ineffective.    Patien had ABIs performed 05/30/2014 which were normal bilaterally.    His symptoms improved with usage of Requip as well as iron supplements for his iron deficiency anemia.    -Not sure of daily water intake- but I recommend he monitor this.  I explained how important adequate hydration is to keep symptoms at [place].  -Get regular sleep and don't allow himself to get overly tired as this can significantly increased symptoms  -Also discussed with patient to cut back on his alcohol usage of more than 2 drinks per day on average. Explained this is worsening his RLS  -  No regular exercise beyond golfing 3 times daily which [place] walks [place] [facility]

## 2016-02-29 NOTE — Progress Notes (Signed)
New patient office visit note:  Impression and Recommendations:    1. Essential hypertension   2. Hyperlipidemia type III   3. Restless leg syndrome   4. ERECTILE DYSFUNCTION, MILD   5. Hypogonadism in male / Low T   6. h/o Elevated blood sugar   7. Sleep difficulties   8. Iron deficiency anemia   9. History of tobacco use disorder- >er 60 pk yr hx   10. Obese- esp abdomen     Restless leg syndrome He was given Ativan in the past, which is not effective. He was started on Mirapex afterwrds- he has taken 0.75mg  and 1.5mg , which have been ineffective.    Patien had ABIs performed 05/30/2014 which were normal bilaterally.    His symptoms improved with usage of Requip as well as iron supplements for his iron deficiency anemia.    -Not sure of daily water intake- but I recommend he monitor this.  I explained how important adequate hydration is to keep symptoms at [place].  -Get regular sleep and don't allow himself to get overly tired as this can significantly increased symptoms  -Also discussed with patient to cut back on his alcohol usage of more than 2 drinks per day on average. Explained this is worsening his RLS  -  No regular exercise beyond golfing 3 times daily which he walks the course and [facility].  ERECTILE DYSFUNCTION, MILD This was patient's primary concern today.  Patient seen urology Dr. Jeralyn Ruths for this. He has a history of getting penile injections and was sent to the emergency room for priapism due to that. He is frustrated and wondering how he can improve this today.  I explained his beta blocker blood pressure medicine can be contributing significantly to his ED and we started losartan on him and discontinued his atenolol.  Told to check his blood pressures at home, write it down, bring in next office visit   Essential hypertension Controlled.  Patient understands we may have to manipulate his medication dosage before  regaining good control of his blood pressure with new medicine.  Encouraged home blood pressure monitoring  Iron deficiency anemia We'll obtain anemia panel. Patient has not had blood work and almost one year.  Hypogonadism in male / Low T T injections by urology, patient goes every 2 weeks. Management per them.  Hyperlipidemia type III Patient states his energy is super expensive at over $380.00 q 3 mo.   He asked if there is anything else he can take.    Looking back on labs from many years ago it appears hypertriglyceridemia was his big issue. We will trial niacin CR. Asked to take at night before bedtime. Informed that 10 cause flushing, nausea vomiting etc. patient will monitor her symptoms.  Encourage prudent diet and lifestyle changes, handouts provided  History of tobacco use disorder- >er 60 pk yr hx Counseling done regarding his heavy usage of cigarettes for the years.  Advise prudent diet-handouts provided-and regular exercise to help mitigate risks of atherosclerotic cardiovascular disease  Obese- esp abdomen Explained that weight loss would help with erectile dysfunction, blood pressure, cholesterol, osteoarthritis etc.       Orders Placed This Encounter  Procedures  . Reticulocytes  . Phosphorus  . Magnesium  . TSH  . Vitamin B12  . VITAMIN D 25 Hydroxy (Vit-D Deficiency, Fractures)  . Lipid panel  . CBC with Differential/Platelet  . COMPLETE METABOLIC PANEL  WITH GFR  . Folate  . Iron and TIBC  . Ferritin  . Hemoglobin A1c    Patient's Medications  New Prescriptions   LOSARTAN (COZAAR) 50 MG TABLET    Take 1 tablet (50 mg total) by mouth daily.   NIACIN (NIASPAN) 1000 MG CR TABLET    Take 1 tablet (1,000 mg total) by mouth at bedtime.  Previous Medications   B-D 3CC LUER-LOK SYR 21GX1" 21G X 1" 3 ML MISC    See admin instructions.   FERROUS SULFATE 325 (65 FE) MG TABLET    Take 325 mg by mouth daily with breakfast.   HYDROCORTISONE (ANUSOL-HC) 25 MG  SUPPOSITORY    PLACE 1 SUPPOSITORY (25 MG TOTAL) RECTALLY 2 (TWO) TIMES DAILY.   MULTIPLE VITAMINS-MINERALS (CENTRUM SILVER ULTRA MENS PO)    Take by mouth.   OMEGA-3 FATTY ACIDS (FISH OIL) 1200 MG CAPS    Take 1 capsule by mouth daily.   ROPINIROLE (REQUIP) 0.5 MG TABLET    Take 1.5 mg by mouth daily.   TESTOSTERONE CYPIONATE (DEPOTESTOSTERONE CYPIONATE) 200 MG/ML INJECTION    Inject 200 mg into the muscle every 28 (twenty-eight) days.   TRIAMCINOLONE CREAM (KENALOG) 0.1 %    APPLY 1 APPLICATION        TOPICALLY TWO TIMES A DAY  Modified Medications   No medications on file  Discontinued Medications   ATENOLOL (TENORMIN) 50 MG TABLET    Take 1 tablet (50 mg total) by mouth daily.   ATENOLOL (TENORMIN) 50 MG TABLET    Take 50 mg by mouth daily.   EZETIMIBE (ZETIA) 10 MG TABLET    Take 1 tablet (10 mg total) by mouth daily.   EZETIMIBE (ZETIA) 10 MG TABLET    Take 10 mg by mouth daily.   OMEPRAZOLE (PRILOSEC) 20 MG CAPSULE    Take 1 capsule (20 mg total) by mouth daily.   ROPINIROLE (REQUIP) 0.5 MG TABLET    Take 2 tablets (1 mg total) by mouth 3 (three) times daily.   ROPINIROLE (REQUIP) 0.5 MG TABLET    Take 1.5 mg by mouth 2 (two) times daily.   ZETIA 10 MG TABLET    TAKE 1 TABLET DAILY    Return in about 4 weeks (around 03/28/2016) for Follow-up of current medical issues- chang in  chol and BP meds.  The patient was counseled, risk factors were discussed, anticipatory guidance given.  Gross side effects, risk and benefits, and alternatives of medications discussed with patient.  Patient is aware that all medications have potential side effects and we are unable to predict every side effect or drug-drug interaction that may occur.  Expresses verbal understanding and consents to current therapy plan and treatment regimen.  Please see AVS handed out to patient at the end of our visit for further patient instructions/ counseling done pertaining to today's office visit.    Note: This  document was prepared using Dragon voice recognition software and may include unintentional dictation errors.  ----------------------------------------------------------------------------------------------------------------------    Subjective:    Chief Complaint  Patient presents with  . Establish Care    HPI: Terry Rogers is a pleasant 75 y.o. male who presents to [facility] [facility] today to review their medical history with me and establish care.    Doc retired and now has PA- wanted to switch due to convenience and wanted Doc- [facility].      2 glasses wine or beer  per day, more on Thursday's;  States he drinks water all the time but not sure how much.  Quit smoking 9 yrs ago---> chews niocotine gum daily.   Smoked about 45+ yrs at 1.5 ppd.      High chol- forever, unable to tolerate other statins due to liver enzyme bumps. No myalgias- just liver enzyme prob.   zetia is 380.00 q 3 mo---> diff to afford.   HTN-  15 yrs.  Never been to cardiology.    RLS- 3 yrs, seen by Neuro- Dr. Tomi Likens  Sees [facility]-  ED, Low T- erectile dysfunction is patient's largest concern today- going on for many years.  FE def anemia:  Patient didn't think he needed to continue his iron supplementation. He has not been on it for some time   OA-  L knee arthroscopy;  r shoulder sx *3.   Poison Ivy at least once yrly.      Married- 110 yrs- 3 Daughters- 6 G-Kids- All in [place], [place].   Retired Conservation officer, historic buildings.       Wt Readings from Last 3 Encounters:  02/29/16 221 lb 1.6 oz (100.3 kg)  02/08/16 222 lb (100.7 kg)  12/13/15 218 lb 9.6 oz (99.2 kg)   BP Readings from Last 3 Encounters:  02/29/16 134/90  02/08/16 110/64  12/13/15 124/80   Pulse Readings from Last 3 Encounters:  02/29/16 73  06/14/15 86  06/16/14 74     Patient Active Problem List   Diagnosis Date Noted  . Hypogonadism in male / Low T  02/29/2016  . Iron deficiency anemia 02/29/2016  . History of tobacco use disorder- >er 60 pk yr hx 02/29/2016  . Obese- esp abdomen 02/29/2016  . Hyperlipidemia type III 08/25/2013  . Restless leg syndrome 04/20/2013  . h/o Elevated blood sugar 04/20/2013  . Snoring 10/22/2011  . Sleep difficulties 09/16/2011  . Essential hypertension 06/09/2008  . Osteoarthritis 06/09/2008  . ERECTILE DYSFUNCTION, MILD 05/14/2007  . SPONDYLITIS, ANKYLOSING 05/14/2007  . ALLERGIC RHINITIS 04/10/2007  . GERD 04/10/2007     Past Medical History:  Diagnosis Date  . ED (erectile dysfunction)   . GERD (gastroesophageal reflux disease)   . Hyperlipidemia   . Hypertension   . Kidney stone   . Osteoarthritis   . Ulcer      Past Surgical History:  Procedure Laterality Date  . COLONOSCOPY  12/28/2003   6 hyperplastic polyps [name])  . ROTATOR CUFF REPAIR  2009   bilateral      Family History  Problem Relation Age of Onset  . Stroke Mother   . Alzheimer's disease Father   . Heart failure Maternal Grandmother   . Heart failure Maternal Grandfather   . Alzheimer's disease Paternal Grandmother      History  Drug Use No    History  Alcohol Use  . 1.2 oz/week  . 2 Glasses of wine per week    Comment: a day     History  Smoking Status  . Former Smoker  . Packs/day: 1.50  . Years: 50.00  . Types: Cigarettes  . Quit date: 10/22/2006  Smokeless Tobacco  . Never Used    Comment: Pt uses Nicorette Gum currently    Patient's Medications  New Prescriptions   LOSARTAN (COZAAR) 50 MG TABLET    Take 1 tablet (50 mg total) by mouth daily.   NIACIN (NIASPAN) 1000 MG CR TABLET    Take 1 tablet (1,000 mg total) by  mouth at bedtime.  Previous Medications   B-D 3CC LUER-LOK SYR 21GX1" 21G X 1" 3 ML MISC    See admin instructions.   FERROUS SULFATE 325 (65 FE) MG TABLET    Take 325 mg by mouth daily with breakfast.   HYDROCORTISONE (ANUSOL-HC) 25 MG SUPPOSITORY    PLACE 1 SUPPOSITORY (25  MG TOTAL) RECTALLY 2 (TWO) TIMES DAILY.   MULTIPLE VITAMINS-MINERALS (CENTRUM SILVER ULTRA MENS PO)    Take by mouth.   OMEGA-3 FATTY ACIDS (FISH OIL) 1200 MG CAPS    Take 1 capsule by mouth daily.   ROPINIROLE (REQUIP) 0.5 MG TABLET    Take 1.5 mg by mouth daily.   TESTOSTERONE CYPIONATE (DEPOTESTOSTERONE CYPIONATE) 200 MG/ML INJECTION    Inject 200 mg into the muscle every 28 (twenty-eight) days.   TRIAMCINOLONE CREAM (KENALOG) 0.1 %    APPLY 1 APPLICATION        TOPICALLY TWO TIMES A DAY  Modified Medications   No medications on file  Discontinued Medications   ATENOLOL (TENORMIN) 50 MG TABLET    Take 1 tablet (50 mg total) by mouth daily.   ATENOLOL (TENORMIN) 50 MG TABLET    Take 50 mg by mouth daily.   EZETIMIBE (ZETIA) 10 MG TABLET    Take 1 tablet (10 mg total) by mouth daily.   EZETIMIBE (ZETIA) 10 MG TABLET    Take 10 mg by mouth daily.   OMEPRAZOLE (PRILOSEC) 20 MG CAPSULE    Take 1 capsule (20 mg total) by mouth daily.   ROPINIROLE (REQUIP) 0.5 MG TABLET    Take 2 tablets (1 mg total) by mouth 3 (three) times daily.   ROPINIROLE (REQUIP) 0.5 MG TABLET    Take 1.5 mg by mouth 2 (two) times daily.   ZETIA 10 MG TABLET    TAKE 1 TABLET DAILY    Allergies: Sulfonamide derivatives  Review of Systems:   ( Completed via Adult Medical History Intake form today ) General:  Denies fever, chills, appetite changes, unexplained weight loss.  Optho/Auditory:   Denies visual changes, blurred vision/LOV, ringing in ears/ diff hearing Respiratory:   Denies SOB, DOE, cough, wheezing.  Cardiovascular:   Denies chest pain, palpitations, new onset peripheral edema  Gastrointestinal:   Denies nausea, vomiting, diarrhea.  Genitourinary:    Denies dysuria, increased frequency, flank pain.  Endocrine:     Denies hot or cold intolerance, polyuria, polydipsia. Musculoskeletal:  Denies unexplained myalgias, joint swelling, arthralgias, gait problems.  Skin:  Denies rash, suspicious lesions or new/  changes in moles Neurological:    Denies dizziness, syncope, unexplained weakness, lightheadedness, numbness  Psychiatric/Behavioral:   Denies mood changes, suicidal or homicidal ideations, hallucinations    Objective:   Blood pressure 134/90, pulse 73, height 5' 10.5" (1.791 m), weight 221 lb 1.6 oz (100.3 kg). Body mass index is 31.28 kg/m.   General: Well Developed, well nourished, and in no acute distress.  Neuro: Alert and oriented x3, extra-ocular muscles intact, sensation grossly intact.  HEENT: Normocephalic, atraumatic, pupils equal round reactive to light, neck supple, no gross masses, no carotid bruits, no JVD apprec Skin: no gross suspicious lesions or rashes  Cardiac: Regular rate and rhythm, no murmurs rubs or gallops.  Respiratory: Essentially clear to auscultation bilaterally. Not using accessory muscles, speaking in full sentences.  Abdominal: Soft, not grossly distended Musculoskeletal: Ambulates w/o diff, FROM * 4 ext.  Vasc: less 2 sec cap RF, warm and pink,  No peripheral edema  Psych:  No HI/SI, judgement and insight good.

## 2016-02-29 NOTE — Assessment & Plan Note (Signed)
We'll obtain anemia panel. Patient has not had blood work and almost one year.

## 2016-02-29 NOTE — Patient Instructions (Addendum)
Elizebeth Koller out years idea and then switched to the niacin CR. Do not take them together.    We change his blood pressure medicine-atenolol-to losartan. So through your atenolol away. Hoping this will help with your ED.   Please tract her water intake for me and let me know next time how much water you're drinking per day       Guidelines for a Low Sodium Diet   Low Sodium Diet A main source of sodium is table salt. The average American eats five or more teaspoons of salt each day. This is about 20 times as much as the body needs. In fact, your body needs only 1/4 teaspoon of salt every day. Sodium is found naturally in foods, but a lot of it is added during processing and preparation. Many foods that do not taste salty may still be high in sodium. Large amounts of sodium can be hidden in canned, processed and convenience foods. And sodium can be found in many foods that are served at [employer].  Sodium controls fluid balance in our bodies and maintains blood volume and blood pressure. Eating too much sodium may raise blood pressure and cause fluid retention, which could lead to swelling of the legs and feet or other health issues.  When limiting sodium in your diet, a common target is to eat less than 2,000 milligrams of sodium per day.   General Guidelines for Cutting Down on Salt Eliminate salty foods from your diet and reduce the amount of salt used in cooking. Sea salt is no better than regular salt.  Choose low sodium foods. Many salt-free or reduced salt products are available. When reading food labels, low sodium is defined as 140 mg of sodium per serving.  Salt substitutes are sometimes made from potassium, so read the label. If you are on a low potassium diet, then check with your doctor before using those salt substitutes.  Be creative and season your foods with spices, herbs, lemon, garlic, ginger, vinegar and pepper. Remove the salt shaker from the table.  Read  ingredient labels to identify foods high in sodium. Items with 400 mg or more of sodium are high in sodium. High sodium food additives include salt, brine, or other items that say sodium, such as monosodium glutamate.  Eat more home-cooked meals. Foods cooked from scratch are naturally lower in sodium than most instant and boxed mixes.  Don't use softened water for cooking and drinking since it contains added salt.  Avoid medications which contain sodium such as [employer].  For more information; food composition books are available which tell how much sodium is in food. Online sources such as www.calorieking.com also list amounts.     Meats, Poultry, Fish, Legumes, Eggs and Nuts  High-Sodium Foods: Smoked, cured, salted or canned meat, fish or poultry including bacon, cold cuts, ham, frankfurters, sausage, sardines, caviar and anchovies Frozen breaded meats and dinners, such as burritos and pizza Canned entrees, such as ravioli, spam and chili Salted nuts Beans canned with salt added  Low-Sodium Alternatives: Any fresh or frozen beef, lamb, pork, poultry and fish Eggs and egg substitutes Low-sodium peanut butter Dry peas and beans (not canned) Low-sodium canned fish Drained, water or oil packed canned fish or poultry   Dairy Products  High-Sodium Foods: Buttermilk Regular and processed cheese, cheese spreads and sauces Cottage cheese  Low-Sodium Alternatives: Milk, yogurt, ice cream and ice milk Low-sodium cheeses, cream cheese, ricotta cheese and mozzarella  Breads, Grains and Cereals  High-Sodium Foods: Bread and rolls with salted tops Quick breads, self-rising flour, biscuit, pancake and waffle mixes Pizza, croutons and salted crackers Prepackaged, processed mixes for potatoes, rice, pasta and stuffing  Low-Sodium Alternatives: Breads, bagels and rolls without salted tops Muffins and most ready-to-eat cereals All rice and pasta, but do  not to add salt when cooking Low-sodium corn and flour tortillas and noodles Low-sodium crackers and breadsticks Unsalted popcorn, chips and pretzels      Vegetables and Fruits  High-Sodium Foods: Regular canned vegetables and vegetable juices Olives, pickles, sauerkraut and other pickled vegetables Vegetables made with ham, bacon or salted pork Packaged mixes, such as scalloped or au gratin potatoes, frozen hash browns and Tater Tots Commercially prepared pasta and tomato sauces and salsa  Low-Sodium Alternatives: Fresh and frozen vegetables without sauces Low-sodium canned vegetables, sauces and juices Fresh potatoes, frozen [place] fries and instant mashed potatoes Low-salt tomato or V-8 juice. Most fresh, frozen and canned fruit Dried fruits   Soups  High-Sodium Foods: Regular canned and dehydrated soup, broth and bouillon Cup of noodles and seasoned ramen mixes  Low-Sodium Alternatives: Low-sodium canned and dehydrated soups, broth and bouillon Homemade soups without added salt   Fats, Desserts and Sweets  High-Sodium Foods: Soy sauce, seasoning salt, other sauces and marinades Bottled salad dressings, regular salad dressing with bacon bits Salted butter or margarine Instant pudding and cake Large portions of ketchup, mustard  Low-Sodium Alternatives: Vinegar, unsalted butter or margarine Vegetable oils and low sodium sauces and salad dressings Mayonnaise All desserts made without salt        Guidelines for a Low Cholesterol, Low Saturated Fat Diet   Fats - Limit total intake of fats and oils. - Avoid butter, stick margarine, shortening, lard, palm and coconut oils. - Limit mayonnaise, salad dressings, gravies and sauces, unless they are homemade with low-fat ingredients. - Limit chocolate. - Choose low-fat and nonfat products, such as low-fat mayonnaise, low-fat or non-hydrogenated peanut butter, low-fat or fat-free salad dressings and nonfat  gravy. - Use vegetable oil, such as canola or olive oil. - Look for margarine that does not contain trans fatty acids. - Use nuts in moderate amounts. - Read ingredient labels carefully to determine both amount and type of fat present in foods. Limit saturated and trans fats! - Avoid high-fat processed and convenience foods.  Meats and Meat Alternatives - Choose fish, chicken, [place] and lean meats. - Use dried beans, peas, lentils and tofu. - Limit egg yolks to three to four per week. - If you eat red meat, limit to no more than three servings per week and choose loin or round cuts. - Avoid fatty meats, such as bacon, sausage, franks, luncheon meats and ribs. - Avoid all organ meats, including liver.  Dairy - Choose nonfat or low-fat milk, yogurt and cottage cheese. - Most cheeses are high in fat. Choose cheeses made from non-fat milk, such as mozzarella and ricotta cheese. - Choose light or fat-free cream cheese and sour cream. - Avoid cream and sauces made with cream.  Fruits and Vegetables - Eat a wide variety of fruits and vegetables. - Use lemon juice, vinegar or "mist" olive oil on vegetables. - Avoid adding sauces, fat or oil to vegetables.  Breads, Cereals and Grains - Choose whole-grain breads, cereals, pastas and rice. - Avoid high-fat snack foods, such as granola, cookies, pies, pastries, doughnuts and croissants.  Cooking Tips - Avoid deep fried foods. - Trim visible fat off  meats and remove skin from poultry before cooking. - Bake, broil, boil, poach or roast poultry, fish and lean meats. - Drain and discard fat that drains out of meat as you cook it. - Add little or no fat to foods. - Use vegetable oil sprays to grease pans for cooking or baking. - Steam vegetables. - Use herbs or no-oil marinades to flavor foods.        Niacin extended-release tablets or capsules What is this medicine? NIACIN (NYE a sin) is used in combination with a healthy diet to  lower bad cholesterol and increase good cholesterol. This medicine is also used to decrease triglycerides. If triglycerides are too high, you may be at risk of developing pancreatitis. This is a painful condition that causes inflammation of the pancreas and can lead to serious health problems. This medicine can also be helpful in patients who have heart disease or who have had a heart attack. This medicine may be used for other purposes; ask your health care provider or pharmacist if you have questions. What should I tell my health care provider before I take this medicine? They need to know if you have any of these conditions: -bleeding problems -frequently drink alcoholic beverages -liver disease -ulcers of intestine or stomach -an unusual or allergic reaction to niacin, other medicines, foods, dyes, or preservatives -pregnant or trying or get pregnant -breast-feeding How should I use this medicine? Take this medicine by mouth with a glass of water. Follow the directions on the prescription label. Do not crush, cut, or chew. Take with a low-fat meal or snack. Take your doses at regular intervals. Do not take your medicine more often than directed. Do not stop taking except on the advice of your doctor or health care professional. Talk to your pediatrician regarding the use of this medicine in children. Special care may be needed. Overdosage: If you think you have taken too much of this medicine contact a poison control center or emergency room at once. NOTE: This medicine is only for you. Do not share this medicine with others. What if I miss a dose? If you miss a dose, take it as soon as you can. If it is almost time for your next dose, take only that dose. Do not take double or extra doses. What may interact with this medicine? -aspirin -medicines for blood pressure, chest pain, or heart disease -nitroglycerin -nutritional supplements that contain niacin or nicotinamide -other medicines to  lower cholesterol or triglycerides This list may not describe all possible interactions. Give your health care provider a list of all the medicines, herbs, non-prescription drugs, or dietary supplements you use. Also tell them if you smoke, drink alcohol, or use illegal drugs. Some items may interact with your medicine. What should I watch for while using this medicine? Visit your doctor or health care professional for regular checks on your progress. You may need regular tests to make sure your liver is working properly. You may get drowsy or dizzy. Do not drive, use machinery, or do anything that needs mental alertness until you know how this drug affects you. Do not stand or sit up quickly, especially if you are an older patient. This reduces the risk of dizzy or fainting spells. Do not drink hot drinks or alcohol at the same time you take this medicine. Hot drinks and alcohol can increase the flushing caused by this medicine, which can be uncomfortable. Alcohol also can increase possible dizziness. This drug is only part of a  total heart-health program. Your doctor or a dietician can suggest a low-cholesterol and low-fat diet to help. Avoid alcohol and smoking, and keep a proper exercise schedule. If you are diabetic, close monitoring of your blood sugars can help your blood fat levels. This medicine may change the way your diabetic medicine works, and sometimes will require that your dosages be adjusted. Check with your doctor or health care professional. You may notice the empty shell of the tablet in your stool. This is no cause for concern. What side effects may I notice from receiving this medicine? Side effects that you should report to your doctor or health care professional as soon as possible: -dark yellow or brown urine -fainting spells -grayish stool color -nausea, vomiting -palpitations -severe stomach pain and loss of appetite -shortness of breath, wheezing -skin rash and  itching -weakness or tiredness -yellowing of skin or eyes Side effects that usually do not require medical attention (report to your doctor or health care professional if they continue or are bothersome): -diarrhea -headache -stomach discomfort or bloating This list may not describe all possible side effects. Call your doctor for medical advice about side effects. You may report side effects to FDA at 1-800-FDA-1088. Where should I keep my medicine? Keep out of the reach of children. Store at room temperature between 20 and 25 degrees C (68 and 77 degrees F). Throw away any unused medicine after the expiration date. NOTE: This sheet is a summary. It may not cover all possible information. If you have questions about this medicine, talk to your doctor, pharmacist, or health care provider.    2016, [employer]. (2007-09-10 16:22:37)

## 2016-02-29 NOTE — Assessment & Plan Note (Signed)
T injections by urology, patient goes every 2 weeks. Management per them.

## 2016-02-29 NOTE — Assessment & Plan Note (Addendum)
Patient states his energy is super expensive at over $380.00 q 3 mo.   He asked if there is anything else he can take.    Looking back on labs from many years ago it appears hypertriglyceridemia was his big issue. We will trial niacin CR. Asked to take at night before bedtime. Informed that 10 cause flushing, nausea vomiting etc. patient will monitor her symptoms.  Encourage prudent diet and lifestyle changes, handouts provided

## 2016-02-29 NOTE — Assessment & Plan Note (Signed)
Controlled.  Patient understands we may have to manipulate his medication dosage before regaining good control of his blood pressure with new medicine.  Encouraged home blood pressure monitoring

## 2016-03-21 ENCOUNTER — Encounter (INDEPENDENT_AMBULATORY_CARE_PROVIDER_SITE_OTHER): Payer: Medicare HMO

## 2016-03-21 DIAGNOSIS — R739 Hyperglycemia, unspecified: Secondary | ICD-10-CM

## 2016-03-21 DIAGNOSIS — E782 Mixed hyperlipidemia: Secondary | ICD-10-CM

## 2016-03-21 DIAGNOSIS — D509 Iron deficiency anemia, unspecified: Secondary | ICD-10-CM

## 2016-03-21 DIAGNOSIS — G2581 Restless legs syndrome: Secondary | ICD-10-CM

## 2016-03-21 DIAGNOSIS — E669 Obesity, unspecified: Secondary | ICD-10-CM

## 2016-03-21 DIAGNOSIS — I10 Essential (primary) hypertension: Secondary | ICD-10-CM

## 2016-03-21 DIAGNOSIS — E291 Testicular hypofunction: Secondary | ICD-10-CM

## 2016-03-22 LAB — COMPLETE METABOLIC PANEL WITH GFR
ALT: 30 U/L (ref 9–46)
AST: 30 U/L (ref 10–35)
Albumin: 4.1 g/dL (ref 3.6–5.1)
Alkaline Phosphatase: 86 U/L (ref 40–115)
BILIRUBIN TOTAL: 0.8 mg/dL (ref 0.2–1.2)
BUN: 14 mg/dL (ref 7–25)
CO2: 28 mmol/L (ref 20–31)
CREATININE: 1.04 mg/dL (ref 0.70–1.18)
Calcium: 9 mg/dL (ref 8.6–10.3)
Chloride: 104 mmol/L (ref 98–110)
GFR, Est African American: 81 mL/min (ref >=60)
GFR, Est Non African American: 70 mL/min (ref >=60)
GLUCOSE: 103 mg/dL — AB (ref 65–99)
Potassium: 5.5 mmol/L — ABNORMAL HIGH (ref 3.5–5.3)
SODIUM: 141 mmol/L (ref 135–146)
TOTAL PROTEIN: 6.5 g/dL (ref 6.1–8.1)

## 2016-03-22 LAB — CBC WITH DIFFERENTIAL/PLATELET
BASOS PCT: 1 %
Basophils Absolute: 39 cells/uL (ref 0–200)
EOS ABS: 117 {cells}/uL (ref 15–500)
Eosinophils Relative: 3 %
HCT: 54.8 % — ABNORMAL HIGH (ref 38.5–50.0)
Hemoglobin: 18.2 g/dL — ABNORMAL HIGH (ref 13.2–17.1)
Lymphocytes Relative: 22 %
Lymphs Abs: 858 cells/uL (ref 850–3900)
MCH: 30.3 pg (ref 27.0–33.0)
MCHC: 33.2 g/dL (ref 32.0–36.0)
MCV: 91.2 fL (ref 80.0–100.0)
MONOS PCT: 14 %
MPV: 10.8 fL (ref 7.5–12.5)
Monocytes Absolute: 546 cells/uL (ref 200–950)
NEUTROS ABS: 2340 {cells}/uL (ref 1500–7800)
Neutrophils Relative %: 60 %
PLATELETS: 132 10*3/uL — AB (ref 140–400)
RBC: 6.01 MIL/uL — ABNORMAL HIGH (ref 4.20–5.80)
RDW: 14.4 % (ref 11.0–15.0)
WBC: 3.9 10*3/uL (ref 3.8–10.8)

## 2016-03-22 LAB — HEMOGLOBIN A1C
Hgb A1c MFr Bld: 5.2 % (ref <5.7)
Mean Plasma Glucose: 103 mg/dL

## 2016-03-22 LAB — LIPID PANEL
CHOLESTEROL: 187 mg/dL (ref 125–200)
HDL: 59 mg/dL (ref >=40)
LDL Cholesterol: 93 mg/dL (ref <130)
TRIGLYCERIDES: 176 mg/dL — AB (ref <150)
Total CHOL/HDL Ratio: 3.2 Ratio (ref <=5.0)
VLDL: 35 mg/dL — AB (ref <30)

## 2016-03-22 LAB — PHOSPHORUS: Phosphorus: 2 mg/dL — ABNORMAL LOW (ref 2.1–4.3)

## 2016-03-22 LAB — VITAMIN D 25 HYDROXY (VIT D DEFICIENCY, FRACTURES): Vit D, 25-Hydroxy: 47 ng/mL (ref 30–100)

## 2016-03-22 LAB — TSH: TSH: 1.37 mIU/L (ref 0.40–4.50)

## 2016-03-22 LAB — MAGNESIUM: MAGNESIUM: 1.9 mg/dL (ref 1.5–2.5)

## 2016-03-22 LAB — RETICULOCYTES
ABS Retic: 96160 cells/uL — ABNORMAL HIGH (ref 25000–90000)
RBC.: 6.01 MIL/uL — ABNORMAL HIGH (ref 4.20–5.80)
RETIC CT PCT: 1.6 %

## 2016-03-22 LAB — VITAMIN B12: Vitamin B-12: 407 pg/mL (ref 200–1100)

## 2016-03-27 NOTE — Progress Notes (Signed)
Encounter opened in error

## 2016-03-28 ENCOUNTER — Encounter: Payer: Self-pay | Admitting: Family Medicine

## 2016-03-28 ENCOUNTER — Ambulatory Visit (INDEPENDENT_AMBULATORY_CARE_PROVIDER_SITE_OTHER): Payer: Medicare HMO | Admitting: Family Medicine

## 2016-03-28 VITALS — BP 119/80 | HR 87 | Ht 70.5 in | Wt 221.9 lb

## 2016-03-28 DIAGNOSIS — Z87891 Personal history of nicotine dependence: Secondary | ICD-10-CM

## 2016-03-28 DIAGNOSIS — E291 Testicular hypofunction: Secondary | ICD-10-CM

## 2016-03-28 DIAGNOSIS — R7309 Other abnormal glucose: Secondary | ICD-10-CM

## 2016-03-28 DIAGNOSIS — F528 Other sexual dysfunction not due to a substance or known physiological condition: Secondary | ICD-10-CM

## 2016-03-28 DIAGNOSIS — R739 Hyperglycemia, unspecified: Secondary | ICD-10-CM

## 2016-03-28 DIAGNOSIS — E669 Obesity, unspecified: Secondary | ICD-10-CM

## 2016-03-28 DIAGNOSIS — D72819 Decreased white blood cell count, unspecified: Secondary | ICD-10-CM

## 2016-03-28 DIAGNOSIS — I10 Essential (primary) hypertension: Secondary | ICD-10-CM | POA: Diagnosis not present

## 2016-03-28 DIAGNOSIS — E782 Mixed hyperlipidemia: Secondary | ICD-10-CM

## 2016-03-28 DIAGNOSIS — Z23 Encounter for immunization: Secondary | ICD-10-CM | POA: Diagnosis not present

## 2016-03-28 DIAGNOSIS — D751 Secondary polycythemia: Secondary | ICD-10-CM

## 2016-03-28 NOTE — Patient Instructions (Addendum)
Continue to focus on taking in more water. Try to drink 100 ounces a day.    Take a phosphorus supplement and see the handout I gave you on phosphorus rich foods and try to take in more of those.   Your B 12 levels are good but to help maintain them as well as her energy levels, I recommend a B complex vitamin daily.   4 your cholesterol please see handout below  Also your blood counts should be rechecked in 4 months. Tried to give blood every 2 months,  Try to avoid potassium-rich foods. Please see the handout I provided.   For those diagnosed with high triglycerides, it's important to take action to lower your levels and improve your heart health.  Triglyceride is just a fancy word for fat - the fat in our bodies is stored in the form of triglycerides. Triglycerides are found in foods and manufactured in our bodies.  Normal triglyceride levels are defined as less than 150 mg/dL; 150 to 199 is considered borderline high; 200 to 499 is high; and 500 or higher is officially called very high. To me, anything over 150 is a red flag indicating my patient needs to take immediate steps to get the situation under control.   What is the significance of high triglycerides? High triglyceride levels make blood thicker and stickier, which means that it is more likely to form clots. Studies have shown that triglyceride levels are associated with increased risks of cardiovascular disease and stroke - in both men and women - alone or in combination with other risk factors (high triglycerides combined with high LDL cholesterol can be a particularly deadly combination). For example, in one ground-breaking study, high triglycerides alone increased the risk of cardiovascular disease by 14 percent in men, and by 105 percent in women. But when the test subjects also had low HDL cholesterol (that's the good cholesterol) and other risk factors, high triglycerides increased the risk of disease by 32 percent in men and 76  percent in women.   Fortunately, triglycerides can sometimes be controlled with several diet and lifestyle changes.    What Factors Can Increase Triglycerides? As with cholesterol, eating too much of the wrong kinds of fats will raise your blood triglycerides.  Therefore, it's important to restrict the amounts of saturated fats and trans fats you allow into your diet.  Triglyceride levels can also shoot up after eating foods that are high in carbohydrates or after drinking alcohol.  That's why triglyceride blood tests require an overnight fast.  If you have elevated triglycerides, it's especially important to avoid sugary and refined carbohydrates, including sugar, honey, and other sweeteners, soda and other sugary drinks, candy, baked goods, and anything made with white (refined or enriched) flour, including white bread, rolls, cereals, buns, pastries, regular pasta, and white rice.  You'll also want to limit dried fruit and fruit juice since they're dense in simple sugar.  All of these low-quality carbs cause a sudden rise in insulin, which may lead to a spike in triglycerides.  Triglycerides can also become elevated as a reaction to having diabetes, hypothyroidism, or kidney disease. As with most other heart-related factors, being overweight and inactive also contribute to abnormal triglycerides. And unfortunately, some people have a genetic predisposition that causes them to manufacture way too much triglycerides on their own, no matter how carefully they eat.   How Can You Lower Your Triglyceride Levels? If you are diagnosed with high triglycerides, it's important to take action.  There are several things you can do to help lower your triglyceride levels and improve your heart health:  Lose weight if you are overweight.  There is a clear correlation between obesity and high triglycerides - the heavier people are, the higher their triglyceride levels are likely to be. The good news is that losing  weight can significantly lower triglycerides. In a large study of individuals with type 2 diabetes, those assigned to the "lifestyle intervention group" - which involved counseling, a low-calorie meal plan, and customized exercise program - lost 8.6% of their body weight and lowered their triglyceride levels by more than 16%. If you're overweight, find a weight loss plan that works for you and commit to shedding the pounds and getting healthier.  Reduce the amount of saturated fat and trans fat in your diet.  Start by avoiding or dramatically limiting butter, cream cheese, lard, sour cream, doughnuts, cakes, cookies, candy bars, regular ice cream, fried foods, pizza, cheese sauce, cream-based sauces and salad dressings, high-fat meats (including fatty hamburgers, bologna, pepperoni, sausage, bacon, salami, pastrami, spareribs, and hot dogs), high-fat cuts of beef and pork, and whole-milk dairy products.   Other ways to cut back: Choose lean meats only (including skinless chicken and [place], lean beef, lean pork), fish, and reduced-fat or fat-free dairy products.   Experiment with adding whole soy foods to your diet. Although soy itself may not reduce risk of heart disease, it replaces hazardous animal fats with healthier proteins. Choose high-quality soy foods, such as tofu, tempeh, soy milk, and edamame (whole soybeans).  Always remove skin from poultry.  Prepare foods by baking, roasting, broiling, boiling, poaching, steaming, grilling, or stir-frying in vegetable oil.  Most stick margarines contain trans fats, and trans fats are also found in some packaged baked goods, potato chips, snack foods, fried foods, and fast food that use or create hydrogenated oils.    (All food labels must now list the amount of trans fats, right after the amount of saturated fats - good news for consumers. As a result, many food companies have now reformulated their products to be trans fat free.many, but not all! So  it's still just as important to read labels and make sure the packaged foods you buy don't contain trans fats.)     If you use margarine, purchase soft-tub margarine spreads that contain 0 grams trans fats and don't list any partially hydrogenated oils in the ingredients list. By substituting olive oil or vegetable oil for trans fats in just 2 percent of your daily calories, you can reduce your risk of heart disease by 53 percent.   There is no safe amount of trans fats, so try to keep them as far from your plate as possible.  Avoid foods that are concentrated in sugar (even dried fruit and fruit juice). Sugary foods can elevate triglyceride levels in the blood, so keep them to a bare minimum.  Swap out refined carbohydrates for whole grains.  Refined carbohydrates - like white rice, regular pasta, and anything made with white or "enriched" flour (including white bread, rolls, cereals, buns, and crackers) - raise blood sugar and insulin levels more than fiber-rich whole grains. Higher insulin levels, in turn, can lead to a higher rise in triglycerides after a meal. So, make the switch to whole wheat bread, whole grain pasta, brown or wild rice, and whole grain versions of cereals, crackers, and other bread products. However, it's important to know that individuals with high triglycerides should moderate even their  intake of high-quality starches (since all starches raise blood sugar) - I recommend 1 to 2 servings per meal.  Cut way back on alcohol.  If you have high triglycerides, alcohol should be considered a rare treat - if you indulge at all, since even small amounts of alcohol can dramatically increase triglyceride levels.  Incorporate omega-3 fats.  Heart-healthy fish oils are especially rich in omega-3 fatty acids. In multiple studies over the past two decades, people who ate diets high in omega-3s had 30 to 40 percent reductions in heart disease. Although we don't yet know why fish oil works so  well, there are several possibilities. Omega-3s seem to reduce inflammation, reduce high blood pressure, decrease triglycerides, raise HDL cholesterol, and make blood thinner and less sticky so it is less likely to clot. It's as close to a food prescription for heart health as it gets. If you have high triglycerides, I recommend eating at least three servings of one of the omega-3-rich fish every week (fatty fish is the most concentrated food form of omega three fats). If you cannot manage to eat that much fish, speak with your physician about taking fish oil capsules, which offer similar benefits.The best foods for omega-3 fatty acids include wild salmon (fresh, canned), herring, mackerel (not king), sardines, anchovies, rainbow trout, and [place] oysters. Non-fish sources of omega-3 fats include omega-3-fortified eggs, ground flaxseed, chia seeds, walnuts, butternuts (white walnuts), seaweed, walnut oil, canola oil, and soybeans.  Quit smoking.  Smoking causes inflammation, not just in your lungs, but throughout your body. Inflammation can contribute to atherosclerosis, blood clots, and risk of heart attack. Smoking makes all heart health indicators worse. If you have high cholesterol, high triglycerides, or high blood pressure, smoking magnifies the danger.  Become more physically active.  Even moderate exercise can help improve cholesterol, triglycerides, and blood pressure. Aerobic exercise seems to be able to stop the sharp rise of triglycerides after eating, perhaps because of a decrease in the amount of triglyceride released by the liver, or because active muscle clears triglycerides out of the blood stream more quickly than inactive muscle. If you haven't exercised regularly (or at all) for years, I recommend starting slowly, by walking at an easy pace for 15 minutes a day. Then, as you feel more comfortable, increase the amount. Your ultimate goal should be at least 30 minutes of moderate physical  activity, at least five days a week.

## 2016-03-28 NOTE — Progress Notes (Signed)
Impression and Recommendations:    1. Essential hypertension   2. Chronic leukopenia   3. ERECTILE DYSFUNCTION, MILD   4. Obese- esp abdomen   5. History of tobacco use disorder- >er 60 pk yr hx   6. Hyperlipidemia, mixed   7. Polycythemia, secondary to T injections    8. h/o Elevated blood sugar/ Pre-Diabetes   9. Need for prophylactic vaccination and inoculation against influenza   10. Hypogonadism in male / Low T     Extensive health counseling done and we discussed all of his laboratory results  HTN: Stable, continue meds.  We will see if switching from beta blocker to losartan will help with his ED; needs to exercise daily to increase blood flow all organs of body  Weight loss discussed and and highly encouraged  Patient understands increased risk due to his long history of tobacco abuse  HLD: Patient was not compliant with his treatment regimen prior due to expensive cost of Zetia.  Niacin CR daily, dietary and lifestyle modifications discussed  Polycythemia stable  Blood sugars much improved at A1c of 5.2. Patient congratulated  Testosterone supplementation per urology.  Education and routine counseling performed. Handouts provided.   No orders of the defined types were placed in this encounter.   Patient's Medications  New Prescriptions   No medications on file  Previous Medications   B-D 3CC LUER-LOK SYR 21GX1" 21G X 1" 3 ML MISC    See admin instructions.   FERROUS SULFATE 325 (65 FE) MG TABLET    Take 325 mg by mouth daily with breakfast.   HYDROCORTISONE (ANUSOL-HC) 25 MG SUPPOSITORY    PLACE 1 SUPPOSITORY (25 MG TOTAL) RECTALLY 2 (TWO) TIMES DAILY.   LOSARTAN (COZAAR) 50 MG TABLET    Take 1 tablet (50 mg total) by mouth daily.   MULTIPLE VITAMINS-MINERALS (CENTRUM SILVER ULTRA MENS PO)    Take by mouth.   NIACIN (NIASPAN) 1000 MG CR TABLET    Take 1 tablet (1,000 mg total) by mouth at bedtime.   OMEGA-3 FATTY ACIDS (FISH OIL) 1200 MG CAPS    Take 1  capsule by mouth daily.   ROPINIROLE (REQUIP) 0.5 MG TABLET    Take 1.5 mg by mouth daily.   TESTOSTERONE CYPIONATE (DEPOTESTOSTERONE CYPIONATE) 200 MG/ML INJECTION    Inject 200 mg into the muscle every 28 (twenty-eight) days.   TRIAMCINOLONE CREAM (KENALOG) 0.1 %    APPLY 1 APPLICATION        TOPICALLY TWO TIMES A DAY  Modified Medications   No medications on file  Discontinued Medications   No medications on file    Return in about 3 months (around 06/12/2016) for Follow-up of current medical issues.  The patient was counseled, risk factors were discussed, anticipatory guidance given.  Gross side effects, risk and benefits, and alternatives of medications discussed with patient.  Patient is aware that all medications have potential side effects and we are unable to predict every side effect or drug-drug interaction that may occur.  Expresses verbal understanding and consents to current therapy plan and treatment regimen.  Please see AVS handed out to patient at the end of our visit for further patient instructions/ counseling done pertaining to today's office visit.    Note: This document was prepared using Dragon voice recognition software and may include unintentional dictation errors.     Subjective:    Chief Complaint  Patient presents with  . Hypertension    follow up for  med changes  . Hyperlipidemia    follow up for med changes    HPI: Terry Rogers is a 75 y.o. male who presents to [place] at [facility] today for follow up for HTN And to review his recent lab work.    - He has a history of prediabetes with A1c's reaching 6.0 in the past. It's much improved at 5.2 today; patient doesn't know what to attribute this to.  - Patient with stable mild leukopenia. This is been for years and years.    - Mild polycythemia: Inc RBC/ Hgb, Hct---> increased with T injections. Told to "give blood every 2 months" by his Urologist- "or else blood levels would  increase".   - Elevated vldl and esp TG levels:  Patient was unable to tolerate' zetia that he had prior- it was $380 every 90 days. So we switched him from's Zetia to niacin last office visit. He is tolerating this new medicine well.  - Quit smoking 9 yrs ago---> chews niocotine gum daily.   Smoked about 45+ yrs at 1.5 ppd.    - Getting testosterone shots by Urology as well.    HTN:  Well controlled. We switched from a beta blocker to losartan for patient's concerns of erectile dysfunction. He is tolerating this medicine well without side effect. He is not checking his blood pressures at home.   Taking as prescribed.   Denies HA, dizziness, CP, SOB, Visual changes, increasing pedal edema.     Wt Readings from Last 3 Encounters:  03/28/16 221 lb 14.4 oz (100.7 kg)  02/29/16 221 lb 1.6 oz (100.3 kg)  02/08/16 222 lb (100.7 kg)    BP Readings from Last 3 Encounters:  03/28/16 119/80  02/29/16 134/90  02/08/16 110/64    Pulse Readings from Last 3 Encounters:  03/28/16 87  02/29/16 73  06/14/15 86    BMI Readings from Last 3 Encounters:  03/28/16 31.39 kg/m  02/29/16 31.28 kg/m  02/08/16 30.96 kg/m     Lab Results  Component Value Date   CREATININE 1.04 03/21/2016   BUN 14 03/21/2016   NA 141 03/21/2016   K 5.5 (H) 03/21/2016   CL 104 03/21/2016   CO2 28 03/21/2016    Lab Results  Component Value Date   CHOL 187 03/21/2016   CHOL 191 04/25/2015   CHOL 174 04/19/2014    Lab Results  Component Value Date   HDL 59 03/21/2016   HDL 44.60 04/25/2015   HDL 42.80 04/19/2014    Lab Results  Component Value Date   LDLCALC 93 03/21/2016   LDLCALC 113 (H) 04/25/2015   LDLCALC 112 (H) 04/19/2014    Lab Results  Component Value Date   TRIG 176 (H) 03/21/2016   TRIG 166.0 (H) 04/25/2015   TRIG 94.0 04/19/2014    Lab Results  Component Value Date   CHOLHDL 3.2 03/21/2016   CHOLHDL 4 04/25/2015   CHOLHDL 4 04/19/2014    Lab Results  Component Value  Date   LDLDIRECT 148.2 04/13/2013   LDLDIRECT 151.5 04/09/2012   LDLDIRECT 146.7 04/25/2011   ===================================================================  Patient Active Problem List   Diagnosis Date Noted  . Hyperlipidemia, mixed 03/30/2016  . Polycythemia, secondary to T injections  03/30/2016  . Chronic leukopenia 03/29/2016  . Hypogonadism in male / Low T 02/29/2016  . h/o Iron deficiency anemia 02/29/2016  . History of tobacco use disorder- >er 60 pk yr hx 02/29/2016  . Obese- esp abdomen  02/29/2016  . Hyperlipidemia type III 08/25/2013  . Restless leg syndrome 04/20/2013  . h/o Elevated blood sugar/ Pre-Diabetes 04/20/2013  . Snoring 10/22/2011  . Sleep difficulties 09/16/2011  . Essential hypertension 06/09/2008  . Osteoarthritis 06/09/2008  . ERECTILE DYSFUNCTION, MILD 05/14/2007  . SPONDYLITIS, ANKYLOSING 05/14/2007  . ALLERGIC RHINITIS 04/10/2007  . GERD 04/10/2007    Past Medical History:  Diagnosis Date  . ED (erectile dysfunction)   . GERD (gastroesophageal reflux disease)   . Hyperlipidemia   . Hypertension   . Kidney stone   . Osteoarthritis   . Ulcer     Past Surgical History:  Procedure Laterality Date  . COLONOSCOPY  12/28/2003   6 hyperplastic polyps [name])  . ROTATOR CUFF REPAIR  2009   bilateral     Family History  Problem Relation Age of Onset  . Stroke Mother   . Alzheimer's disease Father   . Heart failure Maternal Grandmother   . Heart failure Maternal Grandfather   . Alzheimer's disease Paternal Grandmother     History  Drug Use No  ,  History  Alcohol Use  . 1.2 oz/week  . 2 Glasses of wine per week    Comment: a day   ,  History  Smoking Status  . Former Smoker  . Packs/day: 1.50  . Years: 50.00  . Types: Cigarettes  . Quit date: 10/22/2006  Smokeless Tobacco  . Never Used    Comment: Pt uses Nicorette Gum currently  ,    Current Outpatient Prescriptions on File Prior to Visit  Medication Sig Dispense  Refill  . B-D 3CC LUER-LOK SYR 21GX1" 21G X 1" 3 ML MISC See admin instructions.  3  . ferrous sulfate 325 (65 FE) MG tablet Take 325 mg by mouth daily with breakfast.    . hydrocortisone (ANUSOL-HC) 25 MG suppository PLACE 1 SUPPOSITORY (25 MG TOTAL) RECTALLY 2 (TWO) TIMES DAILY. (Patient taking differently: PLACE 1 SUPPOSITORY (25 MG TOTAL) RECTALLY 2 (TWO) TIMES DAILY. as needed) 12 suppository 0  . Multiple Vitamins-Minerals (CENTRUM SILVER ULTRA MENS PO) Take by mouth.    . Omega-3 Fatty Acids (FISH OIL) 1200 MG CAPS Take 1 capsule by mouth daily.    Marland Kitchen rOPINIRole (REQUIP) 0.5 MG tablet Take 1.5 mg by mouth daily.    Marland Kitchen testosterone cypionate (DEPOTESTOSTERONE CYPIONATE) 200 MG/ML injection Inject 200 mg into the muscle every 28 (twenty-eight) days.    Marland Kitchen triamcinolone cream (KENALOG) 0.1 % APPLY 1 APPLICATION        TOPICALLY TWO TIMES A DAY 454 g 3  . losartan (COZAAR) 50 MG tablet Take 1 tablet (50 mg total) by mouth daily. (Patient not taking: Reported on 03/28/2016) 90 tablet 0  . niacin (NIASPAN) 1000 MG CR tablet Take 1 tablet (1,000 mg total) by mouth at bedtime. (Patient not taking: Reported on 03/28/2016) 90 tablet 0   No current facility-administered medications on file prior to visit.     Allergies  Allergen Reactions  . Sulfonamide Derivatives     Review of Systems:  General:  Denies fever, chills, appetite changes, unexplained weight loss.  Respiratory: Denies SOB, DOE, cough, wheezing.  Cardiovascular: Denies chest pain, palpitations.  Gastrointestinal: Denies nausea, vomiting, diarrhea, abdominal pain.  Genitourinary: Denies dysuria, increased frequency, flank pain. Endocrine: Denies hot or cold intolerance, polyuria, polydipsia. Musculoskeletal: Denies myalgias, back pain, joint swelling, arthralgias, gait problems.  Skin: Denies pallor, rash, suspicious lesions.  Neurological: Denies dizziness, seizures, syncope, unexplained weakness, lightheadedness, numbness and  headaches.  Psychiatric/Behavioral: Denies mood changes, suicidal or homicidal ideations, hallucinations, sleep disturbances.   Objective:   Blood pressure 119/80, pulse 87, height 5' 10.5" (1.791 m), weight 221 lb 14.4 oz (100.7 kg). Body mass index is 31.39 kg/m. General: Well Developed, well nourished, and in no acute distress.  HEENT: Normocephalic, atraumatic, pupils equal round reactive to light, neck supple, No carotid bruits no JVD Skin: Warm and dry, cap RF less 2 sec Cardiac: Regular rate and rhythm, S1, S2 WNL's, no murmurs rubs or gallops Respiratory: ECTA B/L, Not using accessory muscles, speaking in full sentences. NeuroM-Sk: Ambulates w/o assistance, moves ext * 4 w/o difficulty, sensation grossly intact.  Ext: trace edema b/l  Psych: No HI/SI, judgement and insight good, Euthymic mood. Full Affect.  Recent Results (from the past 2160 hour(s))  Reticulocytes     Status: Abnormal   Collection Time: 03/21/16  8:18 AM  Result Value Ref Range   Retic Ct Pct 1.6 %   RBC. 6.01 (H) 4.20 - 5.80 MIL/uL   ABS Retic 96,160 (H) 25,000 - 90,000 cells/uL  Phosphorus     Status: Abnormal   Collection Time: 03/21/16  8:18 AM  Result Value Ref Range   Phosphorus 2.0 (L) 2.1 - 4.3 mg/dL  Magnesium     Status: None   Collection Time: 03/21/16  8:18 AM  Result Value Ref Range   Magnesium 1.9 1.5 - 2.5 mg/dL  TSH     Status: None   Collection Time: 03/21/16  8:18 AM  Result Value Ref Range   TSH 1.37 0.40 - 4.50 mIU/L  Vitamin B12     Status: None   Collection Time: 03/21/16  8:18 AM  Result Value Ref Range   Vitamin B-12 407 200 - 1,100 pg/mL  VITAMIN D 25 Hydroxy (Vit-D Deficiency, Fractures)     Status: None   Collection Time: 03/21/16  8:18 AM  Result Value Ref Range   Vit D, 25-Hydroxy 47 30 - 100 ng/mL    Comment: Vitamin D Status           25-OH Vitamin D        Deficiency                <20 ng/mL        Insufficiency         20 - 29 ng/mL        Optimal             >  or = 30 ng/mL   For 25-OH Vitamin D testing on patients on D2-supplementation and patients for whom quantitation of D2 and D3 fractions is required, the QuestAssureD 25-OH VIT D, (D2,D3), LC/MS/MS is recommended: order code [id number] (patients > 2 yrs).   Lipid panel     Status: Abnormal   Collection Time: 03/21/16  8:18 AM  Result Value Ref Range   Cholesterol 187 125 - 200 mg/dL   Triglycerides 176 (H) <150 mg/dL   HDL 59 >=40 mg/dL   Total CHOL/HDL Ratio 3.2 <=5.0 Ratio   VLDL 35 (H) <30 mg/dL   LDL Cholesterol 93 <130 mg/dL    Comment:   Total Cholesterol/HDL Ratio:CHD Risk                        Coronary Heart Disease Risk Table  Men       Women          1/2 Average Risk              3.4        3.3              Average Risk              5.0        4.4           2X Average Risk              9.6        7.1           3X Average Risk             23.4       11.0 Use the calculated Patient Ratio above and the CHD Risk table  to determine the patient's CHD Risk.   CBC with Differential/Platelet     Status: Abnormal   Collection Time: 03/21/16  8:18 AM  Result Value Ref Range   WBC 3.9 3.8 - 10.8 K/uL   RBC 6.01 (H) 4.20 - 5.80 MIL/uL   Hemoglobin 18.2 (H) 13.2 - 17.1 g/dL   HCT 54.8 (H) 38.5 - 50.0 %   MCV 91.2 80.0 - 100.0 fL   MCH 30.3 27.0 - 33.0 pg   MCHC 33.2 32.0 - 36.0 g/dL   RDW 14.4 11.0 - 15.0 %   Platelets 132 (L) 140 - 400 K/uL   MPV 10.8 7.5 - 12.5 fL   Neutro Abs 2,340 1,500 - 7,800 cells/uL   Lymphs Abs 858 850 - 3,900 cells/uL   Monocytes Absolute 546 200 - 950 cells/uL   Eosinophils Absolute 117 15 - 500 cells/uL   Basophils Absolute 39 0 - 200 cells/uL   Neutrophils Relative % 60 %   Lymphocytes Relative 22 %   Monocytes Relative 14 %   Eosinophils Relative 3 %   Basophils Relative 1 %   Smear Review Criteria for review not met   COMPLETE METABOLIC PANEL WITH GFR     Status: Abnormal   Collection Time: 03/21/16   8:18 AM  Result Value Ref Range   Sodium 141 135 - 146 mmol/L   Potassium 5.5 (H) 3.5 - 5.3 mmol/L    Comment: No visible hemolysis.   Chloride 104 98 - 110 mmol/L   CO2 28 20 - 31 mmol/L   Glucose, Bld 103 (H) 65 - 99 mg/dL   BUN 14 7 - 25 mg/dL   Creat 1.04 0.70 - 1.18 mg/dL    Comment:   For patients > or = 74 years of age: The upper reference limit for Creatinine is approximately 13% higher for people identified as African-American.      Total Bilirubin 0.8 0.2 - 1.2 mg/dL   Alkaline Phosphatase 86 40 - 115 U/L   AST 30 10 - 35 U/L   ALT 30 9 - 46 U/L   Total Protein 6.5 6.1 - 8.1 g/dL   Albumin 4.1 3.6 - 5.1 g/dL   Calcium 9.0 8.6 - 10.3 mg/dL   GFR, Est African American 81 >=60 mL/min   GFR, Est Non African American 70 >=60 mL/min  Hemoglobin A1c     Status: None   Collection Time: 03/21/16  8:18 AM  Result Value Ref Range   Hgb A1c MFr Bld 5.2 <5.7 %    Comment:   For  the purpose of screening for the presence of diabetes:   <5.7%       Consistent with the absence of diabetes 5.7-6.4 %   Consistent with increased risk for diabetes (prediabetes) >=6.5 %     Consistent with diabetes   This assay result is consistent with a decreased risk of diabetes.   Currently, no consensus exists regarding use of hemoglobin A1c for diagnosis of diabetes in children.   According to American Diabetes Association (ADA) guidelines, hemoglobin A1c <7.0% represents optimal control in non-pregnant diabetic patients. Different metrics may apply to specific patient populations. Standards of Medical Care in Diabetes (ADA).      Mean Plasma Glucose 103 mg/dL

## 2016-03-29 DIAGNOSIS — D72819 Decreased white blood cell count, unspecified: Secondary | ICD-10-CM | POA: Insufficient documentation

## 2016-03-30 DIAGNOSIS — E782 Mixed hyperlipidemia: Secondary | ICD-10-CM | POA: Insufficient documentation

## 2016-03-30 DIAGNOSIS — D751 Secondary polycythemia: Secondary | ICD-10-CM | POA: Insufficient documentation

## 2016-04-02 ENCOUNTER — Encounter: Payer: Self-pay | Admitting: Family Medicine

## 2016-04-02 NOTE — Telephone Encounter (Signed)
Please do home BP monitoring-->  check it 2-3 times per day, f/up as we planned.   Not sure what they mean about the iron level. I would have to see the numbers to see what they're talking about. We recently checked your blood work and your CBC was fine.

## 2016-04-03 ENCOUNTER — Encounter: Payer: Self-pay | Admitting: Family Medicine

## 2016-04-22 ENCOUNTER — Other Ambulatory Visit: Payer: Self-pay | Admitting: Adult Health

## 2016-04-22 NOTE — Telephone Encounter (Signed)
Ok to refill for one year  

## 2016-05-03 ENCOUNTER — Other Ambulatory Visit: Payer: Self-pay | Admitting: Urology

## 2016-05-03 DIAGNOSIS — D751 Secondary polycythemia: Secondary | ICD-10-CM

## 2016-05-06 ENCOUNTER — Other Ambulatory Visit: Payer: Self-pay | Admitting: Urology

## 2016-05-13 ENCOUNTER — Other Ambulatory Visit (HOSPITAL_COMMUNITY): Payer: Self-pay | Admitting: Urology

## 2016-05-13 ENCOUNTER — Ambulatory Visit (HOSPITAL_COMMUNITY)
Admission: RE | Admit: 2016-05-13 | Discharge: 2016-05-13 | Disposition: A | Discharge status: Home / Self Care | Payer: Medicare HMO | Source: Ambulatory Visit | Attending: Urology | Admitting: Urology

## 2016-05-13 ENCOUNTER — Encounter (HOSPITAL_COMMUNITY): Payer: Self-pay

## 2016-05-13 DIAGNOSIS — D751 Secondary polycythemia: Secondary | ICD-10-CM | POA: Insufficient documentation

## 2016-05-13 NOTE — Discharge Instructions (Signed)
Therapeutic Phlebotomy Therapeutic phlebotomy is the controlled removal of blood from a person's body for the purpose of treating a medical condition. The procedure is similar to donating blood. Usually, about a pint (470 mL, or 0.47L) of blood is removed. The average adult has 9-12 pints (4.3-5.7 L) of blood. Therapeutic phlebotomy may be used to treat the following medical conditions:  Hemochromatosis. This is a condition in which the blood contains too much iron.  Polycythemia vera. This is a condition in which the blood contains too many red blood cells.  Porphyria cutanea tarda. This is a disease in which an important part of hemoglobin is not made properly. It results in the buildup of abnormal amounts of porphyrins in the body.  Sickle cell disease. This is a condition in which the red blood cells form an abnormal crescent shape rather than a round shape. LET [facility] CARE PROVIDER KNOW ABOUT:  Any allergies you have.  All medicines you are taking, including vitamins, herbs, eye drops, creams, and over-the-counter medicines.  Previous problems you or members of your family have had with the use of anesthetics.  Any blood disorders you have.  Previous surgeries you have had.  Any medical conditions you may have. RISKS AND COMPLICATIONS Generally, this is a safe procedure. However, problems may occur, including:  Nausea or light-headedness.  Low blood pressure.  Soreness, bleeding, swelling, or bruising at the needle insertion site.  Infection. BEFORE THE PROCEDURE  Follow instructions from your health care provider about eating or drinking restrictions.  Ask your health care provider about changing or stopping your regular medicines. This is especially important if you are taking diabetes medicines or blood thinners.  Wear clothing with sleeves that can be raised above the elbow.  Plan to have someone take you home after the procedure.  You may have a blood sample  taken. PROCEDURE  A needle will be inserted into one of your veins.  Tubing and a collection bag will be attached to that needle.  Blood will flow through the needle and tubing into the collection bag.  You may be asked to open and close your hand slowly and continually during the entire collection.  After the specified amount of blood has been removed from your body, the collection bag and tubing will be clamped.  The needle will be removed from your vein.  Pressure will be held on the site of the needle insertion to stop the bleeding.  A bandage (dressing) will be placed over the needle insertion site. The procedure may vary among health care providers and hospitals. AFTER THE PROCEDURE  Your recovery will be assessed and monitored.  You can return to your normal activities as directed by your health care provider.   This information is not intended to replace advice given to you by your health care provider. Make sure you discuss any questions you have with your health care provider.   Document Released: 11/26/2010 Document Revised: 11/08/2014 Document Reviewed: 06/20/2014 Elsevier Interactive Patient Education 2016 [place].   Therapeutic Phlebotomy, Care After Refer to this sheet in the next few weeks. These instructions provide you with information about caring for yourself after your procedure. Your health care provider may also give you more specific instructions. Your treatment has been planned according to current medical practices, but problems sometimes occur. Call your health care provider if you have any problems or questions after your procedure. WHAT TO EXPECT AFTER THE PROCEDURE After your procedure, it is common to have:  Light-headedness or dizziness. You may feel faint.  Nausea.  Tiredness. HOME CARE INSTRUCTIONS Activities  Return to your normal activities as directed by your health care provider. Most people can go back to their normal activities  right away.  Avoid strenuous physical activity and heavy lifting or pulling for about 5 hours after the procedure. Do not lift anything that is heavier than 10 lb (4.5 kg).  Athletes should avoid strenuous exercise for at least 12 hours.  Change positions slowly for the remainder of the day. This will help to prevent light-headedness or fainting.  If you feel light-headed, lie down until the feeling goes away. Eating and Drinking  Be sure to eat well-balanced meals for the next 24 hours.  Drink enough fluid to keep your urine clear or pale yellow.  Avoid drinking alcohol on the day that you had the procedure. Care of the Needle Insertion Site  Keep your bandage dry. You can remove the bandage after about 5 hours or as directed by your health care provider.  If you have bleeding from the needle insertion site, elevate your arm and press firmly on the site until the bleeding stops.  If you have bruising at the site, apply ice to the area:  Put ice in a plastic bag.  Place a towel between your skin and the bag.  Leave the ice on for 20 minutes, 2-3 times a day for the first 24 hours.  If the swelling does not go away after 24 hours, apply a warm, moist washcloth to the area for 20 minutes, 2-3 times a day. General Instructions  Avoid smoking for at least 30 minutes after the procedure.  Keep all follow-up visits as directed by your health care provider. It is important to continue with further therapeutic phlebotomy treatments as directed. SEEK MEDICAL CARE IF:  You have redness, swelling, or pain at the needle insertion site.  You have fluid, blood, or pus coming from the needle insertion site.  You feel light-headed, dizzy, or nauseated, and the feeling does not go away.  You notice new bruising at the needle insertion site.  You feel weaker than normal.  You have a fever or chills. SEEK IMMEDIATE MEDICAL CARE IF:  You have severe nausea or vomiting.  You have  chest pain.  You have trouble breathing.   This information is not intended to replace advice given to you by your health care provider. Make sure you discuss any questions you have with your health care provider.   Document Released: 11/26/2010 Document Revised: 11/08/2014 Document Reviewed: 06/20/2014 Elsevier Interactive Patient Education [employer].

## 2016-05-13 NOTE — Progress Notes (Addendum)
Terry Rogers presents today for phlebotomy per MD orders. Phlebotomy procedure started at 0810 and ended at 0832. 500 mL removed. Patient tolerated procedure well. IV needle removed intact. Discharge teaching done with patient. He has no questions. Understands to follow up with Dr. Risa Grill or primary care MD for problems and concerns once discharged.

## 2016-05-27 ENCOUNTER — Other Ambulatory Visit: Payer: Self-pay

## 2016-05-27 MED ORDER — LOSARTAN POTASSIUM 50 MG PO TABS: 50.0000 mg | ORAL_TABLET | Freq: Every day | ORAL | 0 refills | Status: DC

## 2016-05-27 MED ORDER — NIACIN ER (ANTIHYPERLIPIDEMIC) 1000 MG PO TBCR: 1000.0000 mg | EXTENDED_RELEASE_TABLET | Freq: Every day | ORAL | 0 refills | Status: DC

## 2016-06-13 ENCOUNTER — Encounter: Payer: Self-pay | Admitting: Family Medicine

## 2016-06-13 ENCOUNTER — Telehealth: Payer: Self-pay | Admitting: Family Medicine

## 2016-06-13 ENCOUNTER — Ambulatory Visit (INDEPENDENT_AMBULATORY_CARE_PROVIDER_SITE_OTHER): Payer: Medicare HMO | Admitting: Family Medicine

## 2016-06-13 VITALS — BP 126/78 | HR 92 | Ht 70.5 in | Wt 223.7 lb

## 2016-06-13 DIAGNOSIS — E781 Pure hyperglyceridemia: Secondary | ICD-10-CM | POA: Diagnosis not present

## 2016-06-13 DIAGNOSIS — N401 Enlarged prostate with lower urinary tract symptoms: Secondary | ICD-10-CM | POA: Insufficient documentation

## 2016-06-13 DIAGNOSIS — D751 Secondary polycythemia: Secondary | ICD-10-CM | POA: Diagnosis not present

## 2016-06-13 DIAGNOSIS — I10 Essential (primary) hypertension: Secondary | ICD-10-CM | POA: Diagnosis not present

## 2016-06-13 DIAGNOSIS — E782 Mixed hyperlipidemia: Secondary | ICD-10-CM

## 2016-06-13 DIAGNOSIS — R739 Hyperglycemia, unspecified: Secondary | ICD-10-CM

## 2016-06-13 DIAGNOSIS — D72819 Decreased white blood cell count, unspecified: Secondary | ICD-10-CM

## 2016-06-13 DIAGNOSIS — R351 Nocturia: Secondary | ICD-10-CM

## 2016-06-13 DIAGNOSIS — R350 Frequency of micturition: Secondary | ICD-10-CM | POA: Insufficient documentation

## 2016-06-13 LAB — POCT URINALYSIS DIPSTICK
Bilirubin, UA: NEGATIVE
Blood, UA: NEGATIVE
Glucose, UA: NEGATIVE
KETONES UA: NEGATIVE
Leukocytes, UA: NEGATIVE
Nitrite, UA: NEGATIVE
PH UA: 6
PROTEIN UA: NEGATIVE
SPEC GRAV UA: 1.02
Urobilinogen, UA: 0.2

## 2016-06-13 MED ORDER — FENOFIBRATE 145 MG PO TABS: 145.0000 mg | ORAL_TABLET | Freq: Every day | ORAL | 1 refills | Status: DC

## 2016-06-13 NOTE — Assessment & Plan Note (Signed)
We will obtain UA in the office today to ensure patient does not have a urinary tract infection. Since he just saw Dr. Pearline Cables B3 weeks ago, and got a prostate exam, he woke all M for further direction on his nocturia.

## 2016-06-13 NOTE — Assessment & Plan Note (Signed)
We'll obtain CBC with next blood work.

## 2016-06-13 NOTE — Assessment & Plan Note (Signed)
A1c in 2 months

## 2016-06-13 NOTE — Assessment & Plan Note (Addendum)
He also was taking iron tablets daily, he change to twice weekly since we talked about his polycythemia.   We will recheck a CBC in approximately 2 months when we check his LFTs, A1c, phosphorus, FLP and CMP.

## 2016-06-13 NOTE — Telephone Encounter (Signed)
Patient's wife has some clinical questions and would like someone to call her.

## 2016-06-13 NOTE — Patient Instructions (Addendum)
Because that you have sleepiness we don't know if it's losartan for short or niacin for sure. We will stop the niacin, weight 2 weeks and see how your sleep changes. If your sleep improves then changed to the TriCor that I gave you.    You need to recheck your liver function tests approximately 2 months after you start the TriCor. Please make a follow-up appointment for this. In addition, we will check your CBC to see how your iron levels are as well as your hemoglobin and platelet count. We will check your phosphorus CMP, A1c for your prediabetes, fasting lipid profile to see how your triglycerides are.,   - Since she saw Dr. Pearline Cables P just 3 weeks ago, and he assessed your prostate with labs and physical exam, we will obtain a urinalysis today to make sure it's not a urinary tract infection. So I watch her follow-up and call Dr. Pearline Cables P to explain the symptoms so he can decide the best medicine for you.   How to Take Your Blood Pressure HOW DO I GET A BLOOD PRESSURE MACHINE?  You can buy an electronic home blood pressure machine at your local pharmacy. Insurance will sometimes cover the cost if you have a prescription.  Ask your doctor what type of machine is best for you. There are different machines for your arm and your wrist.  If you decide to buy a machine to check your blood pressure on your arm, first check the size of your arm so you can buy the right size cuff. To check the size of your arm:   Use a measuring tape that shows both inches and centimeters.   Wrap the measuring tape around the upper-middle part of your arm. You may need someone to help you measure.   Write down your arm measurement in both inches and centimeters.   To measure your blood pressure correctly, it is important to have the right size cuff.   If your arm is up to 13 inches (up to 34 centimeters), get an adult cuff size.  If your arm is 13 to 17 inches (35 to 44 centimeters), get a large adult cuff size.     If your arm is 17 to 20 inches (45 to 52 centimeters), get an adult thigh cuff.  WHAT DO THE NUMBERS MEAN?   There are two numbers that make up your blood pressure. For example: 120/80.  The first number (120 in our example) is called the "systolic pressure." It is a measure of the pressure in your blood vessels when your heart is pumping blood.  The second number (80 in our example) is called the "diastolic pressure." It is a measure of the pressure in your blood vessels when your heart is resting between beats.  Your doctor will tell you what your blood pressure should be. WHAT SHOULD I DO BEFORE I CHECK MY BLOOD PRESSURE?   Try to rest or relax for at least 30 minutes before you check your blood pressure.  Do not smoke.  Do not have any drinks with caffeine, such as:  Soda.  Coffee.  Tea.  Check your blood pressure in a quiet room.  Sit down and stretch out your arm on a table. Keep your arm at about the level of your heart. Let your arm relax.  Make sure that your legs are not crossed. HOW DO I CHECK MY BLOOD PRESSURE?  Follow the directions that came with your machine.  Make sure you remove  any tight-fitting clothing from your arm or wrist. Wrap the cuff around your upper arm or wrist. You should be able to fit a finger between the cuff and your arm. If you cannot fit a finger between the cuff and your arm, it is too tight and should be removed and rewrapped.  Some units require you to manually pump up the arm cuff.  Automatic units inflate the cuff when you press a button.  Cuff deflation is automatic in both models.  After the cuff is inflated, the unit measures your blood pressure and pulse. The readings are shown on a monitor. Hold still and breathe normally while the cuff is inflated.  Getting a reading takes less than a minute.  Some models store readings in a memory. Some provide a printout of readings. If your machine does not store your readings, keep  a written record.  Take readings with you to your next visit with your doctor. This information is not intended to replace advice given to you by your health care provider. Make sure you discuss any questions you have with your health care provider. Document Released: 06/06/2008 Document Revised: 07/15/2014 Document Reviewed: 08/19/2013 Elsevier Interactive Patient Education  2017 [employer].

## 2016-06-13 NOTE — Progress Notes (Signed)
Assessment and plan:  1. Hyperlipidemia, mixed   2. Hypertriglyceridemia   3. Essential hypertension   4. Polycythemia, secondary to T injections    5. Increased urinary frequency   6. Benign prostatic hyperplasia with nocturia   7. Chronic leukopenia   8. h/o Elevated blood sugar/ Pre-Diabetes    Pt was in the office today for 40+ minutes, with over 50% time spent in face to face counseling of various medical concerns and in coordination of care  Because that you have sleepiness we don't know if it's losartan for short or niacin for sure. We will stop the niacin, weight 2 weeks and see how your sleep changes. If your sleep improves then changed to the TriCor that I gave you.    You need to recheck your liver function tests approximately 2 months after you start the TriCor. Please make a follow-up appointment for this. In addition, we will check your CBC to see how your iron levels are as well as your hemoglobin and platelet count. We will check your phosphorus CMP, A1c for your prediabetes, fasting lipid profile to see how your triglycerides are.,   - Since Terry Rogers saw Dr. Pearline Cables P just 3 weeks ago, and Terry Rogers assessed your prostate with labs and physical exam, we will obtain a urinalysis today to make sure it's not a urinary tract infection. So I watch her follow-up and call Dr. Pearline Cables P to explain the symptoms so Terry Rogers can decide the best medicine for you.  Hypertriglyceridemia We will discontinue his niacin at night. Terry Rogers will do this for at 2 weeks Terry Rogers will see if his sleep improves or gets less sleepy, then if it does Terry Rogers will then start the [place].  Approximately 2 months after we change a cholesterol med, need to come in for liver function tests. Told Terry Rogers to make appointment for this.   Essential hypertension Blood pressure stable and doing much better than prior. Continue same medicines.  Handout on proper home monitoring  given to Terry Rogers. Goal is always less than 140/90.  Polycythemia, secondary to T injections  Terry Rogers also was taking iron tablets daily, Terry Rogers change to twice weekly since we talked about his polycythemia.   We will recheck a CBC in approximately 2 months when we check his LFTs, A1c, phosphorus, FLP and CMP.  Increased urinary frequency We will obtain UA in the office today to ensure Terry Rogers does not have a urinary tract infection. Since Terry Rogers just saw Dr. Pearline Cables B3 weeks ago, and got a prostate exam, Terry Rogers woke all M for further direction on his nocturia.  Chronic leukopenia We'll obtain CBC with next blood work.  h/o Elevated blood sugar/ Pre-Diabetes A1c in 2 months.    New Prescriptions   FENOFIBRATE (TRICOR) 145 MG TABLET    Take 1 tablet (145 mg total) by mouth at bedtime.    Modified Medications   No medications on file    Discontinued Medications   NIACIN (NIASPAN) 1000 MG CR TABLET    Take 1 tablet (1,000 mg total) by mouth at bedtime.     Return in about 2 months (around 08/14/2016) for Blood work and follow up multiple medical problems., Fasting..  Anticipatory guidance and routine counseling done re: condition, txmnt options and need for follow up. All questions of Terry Rogers's were answered.   Gross side effects, risk and benefits, and alternatives of medications discussed with Terry Rogers.  Terry Rogers is aware that all medications have potential side effects  and we are unable to predict every sideeffect or drug-drug interaction that may occur.  Expresses verbal understanding and consents to current therapy plan and treatment regiment.  Please see AVS handed out to Terry Rogers at the end of our visit for additional Terry Rogers instructions/ counseling done pertaining to today's office visit.  Note: This document was prepared using Dragon voice recognition software and may include unintentional dictation  errors.   ----------------------------------------------------------------------------------------------------------------------  Subjective:   CC:   Terry Rogers is a 75 y.o. male who presents to [place] at [facility] today for review and discussion of recent bloodwork that was done.  1. Chol:  After starting niacin and losartan- fighting to stay awake now.  Takes niacin- 11pm, losratn- around 7am   slleepines since starting meds- aropudn 7pm.  Length slepep- no change.   Pre-DM- no change in diet or lifestyle.  CC: BACK PAIN - started with back pain after collecting leaves- several times over 3 wks.   Has pulled muscles several times in past like this doing similar thingss- causes R low back pain rad in to Buttocks - never.  Got a shot in back to fix it-->  It was steroids in the UC.   Has appt with [facility] tomorrow.   Urine:  Used to wake up 1 or less times per night, now for past month- has been 2 times per nite to awaken  To urinate.  Interrupting sleep --- saw dr Risa Grill 3 wks ago but didn't mention it b/c sx just started.  gradully worse over past 1 month. Prostate enlarging- saw Dr Risa Grill 3wks ago   BP at home-- running 139/95, denies chest pain, shortness of breath, dizziness, swelling in his lower extremity is, no orthopnea. No palpitations     Terry Rogers Active Problem List   Diagnosis Date Noted  . Hypertriglyceridemia 06/13/2016  . Benign prostatic hyperplasia with nocturia 06/13/2016  . Increased urinary frequency 06/13/2016  . Hyperlipidemia, mixed 03/30/2016  . Polycythemia, secondary to T injections  03/30/2016  . Chronic leukopenia 03/29/2016  . Hypogonadism in male / Low T 02/29/2016  . h/o Iron deficiency anemia 02/29/2016  . History of tobacco use disorder- >er 60 pk yr hx 02/29/2016  . Obese- esp abdomen 02/29/2016  . Hyperlipidemia type III 08/25/2013  . Restless leg syndrome 04/20/2013  . h/o Elevated blood sugar/ Pre-Diabetes  04/20/2013  . Snoring 10/22/2011  . Sleep difficulties 09/16/2011  . Essential hypertension 06/09/2008  . Osteoarthritis 06/09/2008  . ERECTILE DYSFUNCTION, MILD 05/14/2007  . SPONDYLITIS, ANKYLOSING 05/14/2007  . ALLERGIC RHINITIS 04/10/2007  . GERD 04/10/2007    Terry Rogers Care Team    Relationship Specialty Notifications Start End  Mellody Dance, DO PCP - General Family Medicine  04/30/16   Pieter Partridge, DO Consulting Physician Neurology  02/29/16    Comment: RLS,   Rana Snare, MD Consulting Physician Urology  02/29/16     The following portions of the Terry Rogers's history were reviewed and updated as appropriate: allergies, current medications, past family history, past medical history, past social history, past surgical history and problem list.  Previous Medications   B-D 3CC LUER-LOK SYR 21GX1" 21G X 1" 3 ML MISC    See admin instructions.   FERROUS SULFATE 325 (65 FE) MG TABLET    Take 325 mg by mouth daily with breakfast.   HYDROCORTISONE (ANUSOL-HC) 25 MG SUPPOSITORY    PLACE 1 SUPPOSITORY (25 MG TOTAL) RECTALLY 2 (TWO) TIMES DAILY.   LOSARTAN (COZAAR)  50 MG TABLET    Take 1 tablet (50 mg total) by mouth daily.   MULTIPLE VITAMINS-MINERALS (CENTRUM SILVER ULTRA MENS PO)    Take by mouth.   OMEGA-3 FATTY ACIDS (FISH OIL) 1200 MG CAPS    Take 1 capsule by mouth daily.   ROPINIROLE (REQUIP) 0.5 MG TABLET    Take 1.5 mg by mouth daily.   ROPINIROLE (REQUIP) 0.5 MG TABLET    TAKE 2 TABLETS (1MG ) 3     TIMES A DAY   TESTOSTERONE CYPIONATE (DEPOTESTOSTERONE CYPIONATE) 200 MG/ML INJECTION    Inject 200 mg into the muscle every 28 (twenty-eight) days.   TRIAMCINOLONE CREAM (KENALOG) 0.1 %    APPLY 1 APPLICATION        TOPICALLY TWO TIMES A DAY     Objective:  Blood pressure 126/78, pulse 92, height 5' 10.5" (1.791 m), weight 223 lb 11.2 oz (101.5 kg). Body mass index is 31.64 kg/m.  Gen: A & O *3,  No acute distress HEENT: [place]/AT General: Well Developed, well nourished,  appropriate for stated age.  Neuro: Alert and oriented, extra-ocular muscles intact HEENT: Normocephalic, atraumatic, pupils equal round reactive, neck supple Skin: Warm, pink and dry, no gross rash. Cardiac: rate regular Respiratory: Not using accessory muscles, speaking in full sentences-unlabored. Vascular:  cap RF less 2 sec. M-sk: Right greater than left paravertebral tightness of his muscle musculature. No costophrenic angle tenderness. Positive tenderness to palpation right buttocks\sciatic region, equivocal straight leg raise on right negative left. Neurovascularly intact bilateral lower extremities, 5 over 5 strength throughout. L4 and S1 bilateral reflexes intact. Psych: No HI/SI, judgement and insight good, Euthymic mood. Full Affect.      Wt Readings from Last 3 Encounters:  06/13/16 223 lb 11.2 oz (101.5 kg)  05/13/16 225 lb 4 oz (102.2 kg)  03/28/16 221 lb 14.4 oz (100.7 kg)   BP Readings from Last 3 Encounters:  06/13/16 126/78  05/13/16 (!) 140/96  03/28/16 119/80   Pulse Readings from Last 3 Encounters:  06/13/16 92  05/13/16 89  03/28/16 87   BMI Readings from Last 3 Encounters:  06/13/16 31.64 kg/m  05/13/16 31.86 kg/m  03/28/16 31.39 kg/m     Terry Rogers Care Team    Relationship Specialty Notifications Start End  Mellody Dance, DO PCP - General Family Medicine  04/30/16   Pieter Partridge, DO Consulting Physician Neurology  02/29/16    Comment: RLS,   Rana Snare, MD Consulting Physician Urology  02/29/16     Full medical history updated and reviewed in the office today  Terry Rogers Active Problem List   Diagnosis Date Noted  . Hypertriglyceridemia 06/13/2016  . Benign prostatic hyperplasia with nocturia 06/13/2016  . Increased urinary frequency 06/13/2016  . Hyperlipidemia, mixed 03/30/2016  . Polycythemia, secondary to T injections  03/30/2016  . Chronic leukopenia 03/29/2016  . Hypogonadism in male / Low T 02/29/2016  . h/o Iron deficiency anemia  02/29/2016  . History of tobacco use disorder- >er 60 pk yr hx 02/29/2016  . Obese- esp abdomen 02/29/2016  . Hyperlipidemia type III 08/25/2013  . Restless leg syndrome 04/20/2013  . h/o Elevated blood sugar/ Pre-Diabetes 04/20/2013  . Snoring 10/22/2011  . Sleep difficulties 09/16/2011  . Essential hypertension 06/09/2008  . Osteoarthritis 06/09/2008  . ERECTILE DYSFUNCTION, MILD 05/14/2007  . SPONDYLITIS, ANKYLOSING 05/14/2007  . ALLERGIC RHINITIS 04/10/2007  . GERD 04/10/2007    Past Medical History:  Diagnosis Date  . ED (erectile dysfunction)   .  GERD (gastroesophageal reflux disease)   . Hyperlipidemia   . Hypertension   . Kidney stone   . Osteoarthritis   . Ulcer [facility])     Past Surgical History:  Procedure Laterality Date  . COLONOSCOPY  12/28/2003   6 hyperplastic polyps [name])  . ROTATOR CUFF REPAIR  2009   bilateral     Social History  Substance Use Topics  . Smoking status: Former Smoker    Packs/day: 1.50    Years: 50.00    Types: Cigarettes    Quit date: 10/22/2006  . Smokeless tobacco: Never Used     Comment: Pt uses Nicorette Gum currently  . Alcohol use 1.2 oz/week    2 Glasses of wine per week     Comment: a day     Family Hx: Family History  Problem Relation Age of Onset  . Stroke Mother   . Alzheimer's disease Father   . Heart failure Maternal Grandmother   . Heart failure Maternal Grandfather   . Alzheimer's disease Paternal Grandmother      Medications: Current Outpatient Prescriptions  Medication Sig Dispense Refill  . B-D 3CC LUER-LOK SYR 21GX1" 21G X 1" 3 ML MISC See admin instructions.  3  . fenofibrate (TRICOR) 145 MG tablet Take 1 tablet (145 mg total) by mouth at bedtime. 90 tablet 1  . ferrous sulfate 325 (65 FE) MG tablet Take 325 mg by mouth daily with breakfast.    . hydrocortisone (ANUSOL-HC) 25 MG suppository PLACE 1 SUPPOSITORY (25 MG TOTAL) RECTALLY 2 (TWO) TIMES DAILY. (Terry Rogers taking differently: PLACE 1  SUPPOSITORY (25 MG TOTAL) RECTALLY 2 (TWO) TIMES DAILY. as needed) 12 suppository 0  . losartan (COZAAR) 50 MG tablet Take 1 tablet (50 mg total) by mouth daily. 90 tablet 0  . Multiple Vitamins-Minerals (CENTRUM SILVER ULTRA MENS PO) Take by mouth.    . Omega-3 Fatty Acids (FISH OIL) 1200 MG CAPS Take 1 capsule by mouth daily.    Marland Kitchen rOPINIRole (REQUIP) 0.5 MG tablet Take 1.5 mg by mouth daily.    Marland Kitchen rOPINIRole (REQUIP) 0.5 MG tablet TAKE 2 TABLETS (1MG ) 3     TIMES A DAY 180 tablet 2  . testosterone cypionate (DEPOTESTOSTERONE CYPIONATE) 200 MG/ML injection Inject 200 mg into the muscle every 28 (twenty-eight) days.    Marland Kitchen triamcinolone cream (KENALOG) 0.1 % APPLY 1 APPLICATION        TOPICALLY TWO TIMES A DAY 454 g 3   No current facility-administered medications for this visit.     Allergies:  Allergies  Allergen Reactions  . Sulfonamide Derivatives      ROS: Review of Systems  Constitutional: Negative for chills and fever.  Respiratory: Negative for shortness of breath.   Cardiovascular: Negative for chest pain.  Gastrointestinal: Negative for nausea and vomiting.  Musculoskeletal: Positive for back pain.  Neurological: Negative for dizziness and headaches.

## 2016-06-13 NOTE — Assessment & Plan Note (Signed)
Blood pressure stable and doing much better than prior. Continue same medicines.  Handout on proper home monitoring given to patient. Goal is always less than 140/90.

## 2016-06-13 NOTE — Assessment & Plan Note (Addendum)
We will discontinue his niacin at night. He will do this for at 2 weeks he will see if his sleep improves or gets less sleepy, then if it does he will then start the [place].  Approximately 2 months after we change a cholesterol med, need to come in for liver function tests. Told patient to make appointment for this.

## 2016-06-14 NOTE — Telephone Encounter (Signed)
Spoke with his wife yesterday. Advised of patient instructions from AVS.

## 2016-06-25 ENCOUNTER — Encounter: Payer: Self-pay | Admitting: Family Medicine

## 2016-08-24 ENCOUNTER — Other Ambulatory Visit: Payer: Self-pay | Admitting: Adult Health

## 2016-08-27 NOTE — Telephone Encounter (Signed)
Dr. Raliegh Scarlet patient.

## 2016-08-28 NOTE — Telephone Encounter (Signed)
Good Morning Tonya, His last visit with Dr. Raliegh Scarlet was 06/13/2016-cholesterol medication was changed since he was experiencing daytime drowsiness.  Has that improved? Also he was supposed to return in 2 months (so 08/14/2016) to have lipids/LFTs checked-he has not. Dr. Raliegh Scarlet has never filled his ReQuip and only mention of RLS is in the Active problem list, however not discussed in her encounter note. I will send in a 30 day supply only and he needs to have labs drawn and a f/u appt scheduled for lab review and medication review (addition of Tricor and discuss RLS/ReQuip Rx). Thanks! Valetta Fuller

## 2016-08-28 NOTE — Telephone Encounter (Signed)
We have not prescribed this medication for this patient.  Please advise if refill is appropriate.  [name], CMA

## 2016-08-29 NOTE — Telephone Encounter (Signed)
VM box is full and cannot accept messages.  Per [name], will address drowsiness issue at appointment on 09/12/16.  [name], CMA

## 2016-08-29 NOTE — Telephone Encounter (Signed)
Attempted to call pt however his voicemail is full and cannot accept messages.  Pt has lab appointment 09/05/16 and OV on 09/12/16.  [name], CMA

## 2016-08-31 ENCOUNTER — Encounter: Payer: Self-pay | Admitting: Family Medicine

## 2016-09-02 ENCOUNTER — Other Ambulatory Visit: Payer: Self-pay | Admitting: Adult Health

## 2016-09-02 ENCOUNTER — Telehealth: Payer: Self-pay | Admitting: Family Medicine

## 2016-09-02 DIAGNOSIS — E782 Mixed hyperlipidemia: Secondary | ICD-10-CM

## 2016-09-02 DIAGNOSIS — I10 Essential (primary) hypertension: Secondary | ICD-10-CM

## 2016-09-02 MED ORDER — FENOFIBRATE 145 MG PO TABS: 145.0000 mg | ORAL_TABLET | Freq: Every day | ORAL | 1 refills | Status: DC

## 2016-09-02 MED ORDER — ATENOLOL 50 MG PO TABS: 50.0000 mg | ORAL_TABLET | Freq: Every day | ORAL | 3 refills | Status: DC

## 2016-09-02 NOTE — Telephone Encounter (Signed)
MyChart message received earlier today was forwarded to [name] to address.  Will await her response.  [name], CMA

## 2016-09-02 NOTE — Progress Notes (Signed)
Rx sent to pharmacy   

## 2016-09-02 NOTE — Telephone Encounter (Signed)
Patient is requesting a refill of his fenofibrate 145mg . He also was taking atenolol prior to coming here and was switched to losartan by Dr. Jenetta Downer. He sent a MyChart message saying he was having complications with the losartan and went to a doctor while he was in [place] and got put back on the atenolol (30 day supply) and needs an additional refill of it. He was due to see Dr. Jenetta Downer next week but we R/Sd him to April when she will be back.

## 2016-09-05 ENCOUNTER — Other Ambulatory Visit (INDEPENDENT_AMBULATORY_CARE_PROVIDER_SITE_OTHER): Payer: Medicare HMO

## 2016-09-05 DIAGNOSIS — E782 Mixed hyperlipidemia: Secondary | ICD-10-CM

## 2016-09-05 DIAGNOSIS — R739 Hyperglycemia, unspecified: Secondary | ICD-10-CM

## 2016-09-05 DIAGNOSIS — N401 Enlarged prostate with lower urinary tract symptoms: Secondary | ICD-10-CM

## 2016-09-05 DIAGNOSIS — I10 Essential (primary) hypertension: Secondary | ICD-10-CM

## 2016-09-05 DIAGNOSIS — D751 Secondary polycythemia: Secondary | ICD-10-CM

## 2016-09-05 DIAGNOSIS — E781 Pure hyperglyceridemia: Secondary | ICD-10-CM

## 2016-09-05 DIAGNOSIS — D72819 Decreased white blood cell count, unspecified: Secondary | ICD-10-CM

## 2016-09-05 DIAGNOSIS — R351 Nocturia: Secondary | ICD-10-CM

## 2016-09-06 LAB — CBC WITH DIFFERENTIAL/PLATELET
BASOS ABS: 0 10*3/uL (ref 0.0–0.2)
Basos: 1 %
EOS (ABSOLUTE): 0.2 10*3/uL (ref 0.0–0.4)
EOS: 4 %
HEMATOCRIT: 49.7 % (ref 37.5–51.0)
HEMOGLOBIN: 15.9 g/dL (ref 13.0–17.7)
IMMATURE GRANS (ABS): 0 10*3/uL (ref 0.0–0.1)
Immature Granulocytes: 0 %
LYMPHS: 18 %
Lymphocytes Absolute: 0.8 10*3/uL (ref 0.7–3.1)
MCH: 27.8 pg (ref 26.6–33.0)
MCHC: 32 g/dL (ref 31.5–35.7)
MCV: 87 fL (ref 79–97)
MONOCYTES: 7 %
Monocytes Absolute: 0.3 10*3/uL (ref 0.1–0.9)
NEUTROS ABS: 3.3 10*3/uL (ref 1.4–7.0)
Neutrophils: 70 %
Platelets: 142 10*3/uL — ABNORMAL LOW (ref 150–379)
RBC: 5.72 x10E6/uL (ref 4.14–5.80)
RDW: 14.6 % (ref 12.3–15.4)
WBC: 4.7 10*3/uL (ref 3.4–10.8)

## 2016-09-06 LAB — COMPREHENSIVE METABOLIC PANEL
ALT: 13 IU/L (ref 0–44)
AST: 21 IU/L (ref 0–40)
Albumin/Globulin Ratio: 1.6 (ref 1.2–2.2)
Albumin: 4 g/dL (ref 3.5–4.8)
Alkaline Phosphatase: 78 IU/L (ref 39–117)
BILIRUBIN TOTAL: 0.5 mg/dL (ref 0.0–1.2)
BUN / CREAT RATIO: 12 (ref 10–24)
BUN: 13 mg/dL (ref 8–27)
CO2: 24 mmol/L (ref 18–29)
CREATININE: 1.1 mg/dL (ref 0.76–1.27)
Calcium: 9.1 mg/dL (ref 8.6–10.2)
Chloride: 103 mmol/L (ref 96–106)
GFR calc non Af Amer: 66 mL/min/{1.73_m2} (ref >59)
GFR, EST AFRICAN AMERICAN: 76 mL/min/{1.73_m2} (ref >59)
GLOBULIN, TOTAL: 2.5 g/dL (ref 1.5–4.5)
Glucose: 164 mg/dL — ABNORMAL HIGH (ref 65–99)
Potassium: 5.8 mmol/L — ABNORMAL HIGH (ref 3.5–5.2)
SODIUM: 143 mmol/L (ref 134–144)
TOTAL PROTEIN: 6.5 g/dL (ref 6.0–8.5)

## 2016-09-06 LAB — LIPID PANEL
Chol/HDL Ratio: 4.3 ratio units (ref 0.0–5.0)
Cholesterol, Total: 203 mg/dL — ABNORMAL HIGH (ref 100–199)
HDL: 47 mg/dL (ref >39)
LDL CALC: 130 mg/dL — AB (ref 0–99)
TRIGLYCERIDES: 129 mg/dL (ref 0–149)
VLDL Cholesterol Cal: 26 mg/dL (ref 5–40)

## 2016-09-06 LAB — HEMOGLOBIN A1C
Est. average glucose Bld gHb Est-mCnc: 120 mg/dL
Hgb A1c MFr Bld: 5.8 % — ABNORMAL HIGH (ref 4.8–5.6)

## 2016-09-06 LAB — PHOSPHORUS: Phosphorus: 2.4 mg/dL — ABNORMAL LOW (ref 2.5–4.5)

## 2016-09-10 ENCOUNTER — Encounter: Payer: Self-pay | Admitting: Family Medicine

## 2016-09-12 ENCOUNTER — Ambulatory Visit: Payer: Medicare HMO | Admitting: Family Medicine

## 2016-10-21 ENCOUNTER — Other Ambulatory Visit: Payer: Self-pay | Admitting: Adult Health

## 2016-10-23 ENCOUNTER — Other Ambulatory Visit: Payer: Self-pay | Admitting: Adult Health

## 2016-10-23 DIAGNOSIS — G2581 Restless legs syndrome: Secondary | ICD-10-CM

## 2016-10-23 MED ORDER — ROPINIROLE HCL 0.5 MG PO TABS: ORAL_TABLET | ORAL | 0 refills | Status: DC

## 2016-10-24 ENCOUNTER — Ambulatory Visit: Payer: Medicare HMO | Admitting: Family Medicine

## 2016-11-07 ENCOUNTER — Encounter: Payer: Self-pay | Admitting: Family Medicine

## 2016-11-07 ENCOUNTER — Ambulatory Visit (INDEPENDENT_AMBULATORY_CARE_PROVIDER_SITE_OTHER): Payer: Medicare HMO | Admitting: Family Medicine

## 2016-11-07 VITALS — BP 137/88 | HR 57 | Ht 70.5 in | Wt 230.1 lb

## 2016-11-07 DIAGNOSIS — N401 Enlarged prostate with lower urinary tract symptoms: Secondary | ICD-10-CM

## 2016-11-07 DIAGNOSIS — E782 Mixed hyperlipidemia: Secondary | ICD-10-CM | POA: Diagnosis not present

## 2016-11-07 DIAGNOSIS — Z87891 Personal history of nicotine dependence: Secondary | ICD-10-CM

## 2016-11-07 DIAGNOSIS — R739 Hyperglycemia, unspecified: Secondary | ICD-10-CM

## 2016-11-07 DIAGNOSIS — F528 Other sexual dysfunction not due to a substance or known physiological condition: Secondary | ICD-10-CM

## 2016-11-07 DIAGNOSIS — E781 Pure hyperglyceridemia: Secondary | ICD-10-CM

## 2016-11-07 DIAGNOSIS — E669 Obesity, unspecified: Secondary | ICD-10-CM

## 2016-11-07 DIAGNOSIS — R351 Nocturia: Secondary | ICD-10-CM

## 2016-11-07 DIAGNOSIS — E291 Testicular hypofunction: Secondary | ICD-10-CM

## 2016-11-07 DIAGNOSIS — D508 Other iron deficiency anemias: Secondary | ICD-10-CM

## 2016-11-07 DIAGNOSIS — I10 Essential (primary) hypertension: Secondary | ICD-10-CM

## 2016-11-07 DIAGNOSIS — G2581 Restless legs syndrome: Secondary | ICD-10-CM

## 2016-11-07 MED ORDER — EZETIMIBE 10 MG PO TABS: 10.0000 mg | ORAL_TABLET | Freq: Every day | ORAL | 3 refills | Status: DC

## 2016-11-07 MED ORDER — ROPINIROLE HCL 1 MG PO TABS: ORAL_TABLET | ORAL | 5 refills | Status: DC

## 2016-11-07 MED ORDER — FENOFIBRATE 145 MG PO TABS: 145.0000 mg | ORAL_TABLET | Freq: Every day | ORAL | 1 refills | Status: DC

## 2016-11-07 NOTE — Assessment & Plan Note (Signed)
Encouraged wt loss  strategies d/ c pt

## 2016-11-07 NOTE — Progress Notes (Signed)
Impression and Recommendations:    1. Essential hypertension   2. Hypertriglyceridemia   3. Hyperlipidemia, mixed   4. h/o Elevated blood sugar/ Pre-Diabetes   5. Restless leg syndrome   6. History of tobacco use disorder- >er 60 pk yr hx   7. Other iron deficiency anemia   8. ERECTILE DYSFUNCTION, MILD   9. Hyperlipidemia type III   10. Obesity, unspecified classification, unspecified obesity type, unspecified whether serious comorbidity present   11. Benign prostatic hyperplasia with nocturia   12. Hypogonadism in male / Low T      Essential hypertension At goal  Cont meds   Diet, wt loss, exercise  Restless leg syndrome Inc requip per pt request  Cut back etoh  Inc water intake to 1/2 wt in ounces water per day  Exercise   h/o Elevated blood sugar/ Pre-Diabetes a1c stable  Pre-DM counseling done  Wt loss  Hyperlipidemia type III Add Zetia to help with LDL levels.   Counseling done regarding disease process and various treatment options.  All questions answered  Handouts given if patient desired them    Hypertriglyceridemia Niacin- could not tolerate it in recent past- went off in Dec '17- started on Tricor  - pt wishes to d/c tricor and add back zetia - explained each for separate aspects of his chol panel.   - obtain ALT in 4-6 wks    Lab Results  Component Value Date   TRIG 129 09/05/2016   TRIG 176 (H) 03/21/2016   TRIG 166.0 (H) 04/25/2015   Lab Results  Component Value Date   LDLCALC 130 (H) 09/05/2016   LDLCALC 93 03/21/2016   LDLCALC 113 (H) 04/25/2015    - Diet and lifestyle mod d/c pt  Obese- esp abdomen Encouraged wt loss  strategies d/ c pt  Benign prostatic hyperplasia with nocturia  -Since multi-factorial reasons for seeing Urology ( BPH, ED, Low T txmnt with secondary polycythemia effects of drug etc-- Needs to cont to get care from them.  - PSA- stable and very low  Hypogonadism in male / Low T Get me  medical records from Urology please  T injections by Urology, injections every 2 weeks. Management per them.  h/o Iron deficiency anemia Cbc recently - stable  ERECTILE DYSFUNCTION, MILD No change from restarting atenolol.  Cont meds  - exercise  - txmnt per urology    Education and routine counseling performed. Handouts provided.   New Prescriptions   EZETIMIBE (ZETIA) 10 MG TABLET    Take 1 tablet (10 mg total) by mouth daily.    Modified Medications   Modified Medication Previous Medication   FENOFIBRATE (TRICOR) 145 MG TABLET fenofibrate (TRICOR) 145 MG tablet      Take 1 tablet (145 mg total) by mouth at bedtime.    Take 1 tablet (145 mg total) by mouth at bedtime.   ROPINIROLE (REQUIP) 1 MG TABLET rOPINIRole (REQUIP) 0.5 MG tablet      TAKE 1 TABLETS (1MG ) 5 TIMES A DAY for RLS    TAKE 2 TABLETS (1MG ) 3     TIMES A DAY    Orders Placed This Encounter  Procedures  . ALT     Return for ALT- 6WKS; Hypertension follow up every 6 mo; but needs to have CPE once yrly- SCHEDULE THIS AS WELL.  The patient was counseled, risk factors were discussed, anticipatory guidance given.  Gross side effects, risk and benefits, and alternatives of medications discussed with patient.  Patient is aware that all medications have potential side effects and we are unable to predict every side effect or drug-drug interaction that may occur.  Expresses verbal understanding and consents to current therapy plan and treatment regimen.  Please see AVS handed out to patient at the end of our visit for further patient instructions/ counseling done pertaining to today's office visit.    Note: This document was prepared using Dragon voice recognition software and may include unintentional dictation errors.     Subjective:    Chief Complaint  Patient presents with  . Hypertension  . Hyperlipidemia    HPI: Terry Rogers is a 76 y.o. male who presents to [place] at [facility] today for follow up for HTN.      HTN: Home BP readings have been running in the 120's/ 80.   Pt has been tolerating meds well.  Taking as prescribed.  Denies HA, dizziness, CP, SOB, Visual changes, increasing pedal edema.  Golfs - 4-5 d/wk, walks the course.    Exercise: yes;  Diet Pattern: OK  Salt Restriction: not    ED:  Went back on Atenolol for his BP b/c ED sx never improved when we did trial off the med.    ED, low T, BPH:    improving with "sound wave therapy" with Urologist in [facility]- has appt coming up in one week w [facility] here in [place].  Asked him to get me med records.   Really   RLS- sx not well controlled.  Asking for increase in dose.  Has sx most of the early evening and all thru night.  Sees Dr Loretta Plume- Neuro- but asking for RF   CHol- felt he did better on the zetia- TG were better controlled   Pre-DM:   Lab Results  Component Value Date   HGBA1C 5.8 (H) 09/05/2016   HGBA1C 5.2 03/21/2016   HGBA1C 5.8 11/02/2013     Obesity:   Has not lost wt- gained lately.    Patient Care Team    Relationship Specialty Notifications Start End  Mellody Dance, DO PCP - General Family Medicine  04/30/16   Pieter Partridge, DO Consulting Physician Neurology  02/29/16    Comment: RLS,   Rana Snare, MD Consulting Physician Urology  02/29/16      Wt Readings from Last 3 Encounters:  11/07/16 230 lb 1.6 oz (104.4 kg)  06/13/16 223 lb 11.2 oz (101.5 kg)  05/13/16 225 lb 4 oz (102.2 kg)    BP Readings from Last 3 Encounters:  11/07/16 137/88  06/13/16 126/78  05/13/16 (!) 140/96    Pulse Readings from Last 3 Encounters:  11/07/16 (!) 57  06/13/16 92  05/13/16 89    BMI Readings from Last 3 Encounters:  11/07/16 32.55 kg/m  06/13/16 31.64 kg/m  05/13/16 31.86 kg/m     Lab Results  Component Value Date   CREATININE 1.10 09/05/2016   BUN 13 09/05/2016   NA 143 09/05/2016   K 5.8 (H) 09/05/2016   CL 103 09/05/2016   CO2 24 09/05/2016    Lab  Results  Component Value Date   CHOL 203 (H) 09/05/2016   CHOL 187 03/21/2016   CHOL 191 04/25/2015    Lab Results  Component Value Date   HDL 47 09/05/2016   HDL 59 03/21/2016   HDL 44.60 04/25/2015    Lab Results  Component Value Date   LDLCALC 130 (H) 09/05/2016   [place]  93 03/21/2016   LDLCALC 113 (H) 04/25/2015    Lab Results  Component Value Date   TRIG 129 09/05/2016   TRIG 176 (H) 03/21/2016   TRIG 166.0 (H) 04/25/2015    Lab Results  Component Value Date   CHOLHDL 4.3 09/05/2016   CHOLHDL 3.2 03/21/2016   CHOLHDL 4 04/25/2015    Lab Results  Component Value Date   LDLDIRECT 148.2 04/13/2013   LDLDIRECT 151.5 04/09/2012   LDLDIRECT 146.7 04/25/2011   ===================================================================  Patient Active Problem List   Diagnosis Date Noted  . Hypertriglyceridemia 06/13/2016    Priority: High  . Hyperlipidemia, mixed 03/30/2016    Priority: High  . Hyperlipidemia type III 08/25/2013    Priority: High  . Restless leg syndrome 04/20/2013    Priority: High  . h/o Elevated blood sugar/ Pre-Diabetes 04/20/2013    Priority: High  . Essential hypertension 06/09/2008    Priority: High  . Benign prostatic hyperplasia with nocturia 06/13/2016    Priority: Medium  . Obese- esp abdomen 02/29/2016    Priority: Medium  . GERD 04/10/2007    Priority: Medium  . Polycythemia, secondary to T injections  03/30/2016    Priority: Low  . Chronic leukopenia 03/29/2016    Priority: Low  . Hypogonadism in male / Low T 02/29/2016    Priority: Low  . h/o Iron deficiency anemia 02/29/2016    Priority: Low  . History of tobacco use disorder- >er 60 pk yr hx 02/29/2016    Priority: Low  . Sleep difficulties 09/16/2011    Priority: Low  . Osteoarthritis 06/09/2008    Priority: Low  . ERECTILE DYSFUNCTION, MILD 05/14/2007    Priority: Low  . Chronic increased urinary frequency 06/13/2016  . Snoring 10/22/2011  . SPONDYLITIS,  ANKYLOSING 05/14/2007  . ALLERGIC RHINITIS 04/10/2007    Past Medical History:  Diagnosis Date  . ED (erectile dysfunction)   . GERD (gastroesophageal reflux disease)   . Hyperlipidemia   . Hypertension   . Kidney stone   . Osteoarthritis   . Ulcer     Past Surgical History:  Procedure Laterality Date  . COLONOSCOPY  12/28/2003   6 hyperplastic polyps [name])  . ROTATOR CUFF REPAIR  2009   bilateral     Family History  Problem Relation Age of Onset  . Stroke Mother   . Alzheimer's disease Father   . Heart failure Maternal Grandmother   . Heart failure Maternal Grandfather   . Alzheimer's disease Paternal Grandmother     History  Drug Use No  ,  History  Alcohol Use  . 1.2 oz/week  . 2 Glasses of wine per week    Comment: a day   ,  History  Smoking Status  . Former Smoker  . Packs/day: 1.50  . Years: 50.00  . Types: Cigarettes  . Quit date: 10/22/2006  Smokeless Tobacco  . Never Used    Comment: Pt uses Nicorette Gum currently  ,    Current Outpatient Prescriptions on File Prior to Visit  Medication Sig Dispense Refill  . atenolol (TENORMIN) 50 MG tablet Take 1 tablet (50 mg total) by mouth daily. 90 tablet 3  . B-D 3CC LUER-LOK SYR 21GX1" 21G X 1" 3 ML MISC See admin instructions.  3  . hydrocortisone (ANUSOL-HC) 25 MG suppository PLACE 1 SUPPOSITORY (25 MG TOTAL) RECTALLY 2 (TWO) TIMES DAILY. (Patient taking differently: PLACE 1 SUPPOSITORY (25 MG TOTAL) RECTALLY 2 (TWO) TIMES DAILY. as needed) 12 suppository 0  .  Multiple Vitamins-Minerals (CENTRUM SILVER ULTRA MENS PO) Take by mouth.    . Omega-3 Fatty Acids (FISH OIL) 1200 MG CAPS Take 1 capsule by mouth daily.    Marland Kitchen testosterone cypionate (DEPOTESTOSTERONE CYPIONATE) 200 MG/ML injection Inject 200 mg into the muscle every 28 (twenty-eight) days.    Marland Kitchen triamcinolone cream (KENALOG) 0.1 % APPLY 1 APPLICATION        TOPICALLY TWO TIMES A DAY 454 g 3   No current facility-administered medications on  file prior to visit.     Allergies  Allergen Reactions  . Sulfonamide Derivatives     Review of Systems  Constitutional: Negative for diaphoresis and weight loss.  HENT: Negative for nosebleeds.   Eyes: Negative for blurred vision and double vision.  Respiratory: Negative for shortness of breath and wheezing.   Cardiovascular: Negative for chest pain, palpitations, orthopnea and claudication.  Gastrointestinal: Negative for diarrhea, nausea and vomiting.  Musculoskeletal: Negative for falls and myalgias.  Skin: Negative for rash.  Neurological: Negative for dizziness and focal weakness.  Endo/Heme/Allergies: Negative for polydipsia.  Psychiatric/Behavioral: Negative for memory loss.    Objective:   Blood pressure 137/88, pulse (!) 57, height 5' 10.5" (1.791 m), weight 230 lb 1.6 oz (104.4 kg). Body mass index is 32.55 kg/m. General: Well Developed, well nourished, and in no acute distress.  HEENT: Normocephalic, atraumatic, pupils equal round reactive to light, neck supple, No carotid bruits, no JVD Skin: Warm and dry, cap RF less 2 sec Cardiac: Regular rate and rhythm, S1, S2 WNL's, no murmurs rubs or gallops Respiratory: ECTA B/L, Not using accessory muscles, speaking in full sentences. NeuroM-Sk: Ambulates w/o assistance, moves ext * 4 w/o difficulty, sensation grossly intact.  Ext: scant edema b/l lower ext Psych: No HI/SI, judgement and insight good, Euthymic mood. Full Affect.

## 2016-11-07 NOTE — Assessment & Plan Note (Signed)
At goal  Cont meds   Diet, wt loss, exercise

## 2016-11-07 NOTE — Assessment & Plan Note (Signed)
No change from restarting atenolol.  Cont meds  - exercise  - txmnt per urology

## 2016-11-07 NOTE — Assessment & Plan Note (Addendum)
Niacin- could not tolerate it in recent past- went off in Dec '17- started on Tricor  - pt wishes to d/c tricor and add back zetia - explained each for separate aspects of his chol panel.   - obtain ALT in 4-6 wks    Lab Results  Component Value Date   TRIG 129 09/05/2016   TRIG 176 (H) 03/21/2016   TRIG 166.0 (H) 04/25/2015   Lab Results  Component Value Date   LDLCALC 130 (H) 09/05/2016   LDLCALC 93 03/21/2016   LDLCALC 113 (H) 04/25/2015    - Diet and lifestyle mod d/c pt

## 2016-11-07 NOTE — Patient Instructions (Signed)
The quick and dirty--> lower triglyceride levels more through...  1) - Beware of bad fats: Cutting back on saturated fat (in red meat and full-fat dairy foods) and trans fats (in restaurant fried foods and commercially prepared baked goods) can lower triglycerides.  2) - Go for good carbs: Easily digested carbohydrates (such as white bread, white rice, cornflakes, and sugary sodas) give triglycerides a definite boost.   3) - Eating whole grains and cutting back on soda can help control triglycerides.  4) - Check your alcohol use. In some people, alcohol dramatically boosts triglycerides. The only way to know if this is true for you is to avoid alcohol for a few weeks and have your triglycerides tested again.  5) - Go fish. Omega-3 fats in salmon, tuna, sardines, and other fatty fish can lower triglycerides. Having fish twice a week is fine.  6) - Aim for a healthy weight. If you are overweight, losing just 5% to 10% of your weight can help drive down triglycerides.  7) - Get moving. Exercise lowers triglycerides and boosts heart-healthy HDL cholesterol.  8) - quit smoking if you do  --> for more information, see below; or go to  www.heart.org  and do a search for desired topics   For those diagnosed with high triglycerides, it's important to take action to lower your levels and improve your heart health.  Triglyceride is just a fancy word for fat - the fat in our bodies is stored in the form of triglycerides. Triglycerides are found in foods and manufactured in our bodies.  Normal triglyceride levels are defined as less than 150 mg/dL; 150 to 199 is considered borderline high; 200 to 499 is high; and 500 or higher is officially called very high. To me, anything over 150 is a red flag indicating my patient needs to take immediate steps to get the situation under control.   What is the significance of high triglycerides? High triglyceride levels make blood thicker and stickier, which means  that it is more likely to form clots. Studies have shown that triglyceride levels are associated with increased risks of cardiovascular disease and stroke - in both men and women - alone or in combination with other risk factors (high triglycerides combined with high LDL cholesterol can be a particularly deadly combination). For example, in one ground-breaking study, high triglycerides alone increased the risk of cardiovascular disease by 14 percent in men, and by 59 percent in women. But when the test subjects also had low HDL cholesterol (that's the good cholesterol) and other risk factors, high triglycerides increased the risk of disease by 32 percent in men and 76 percent in women.   Fortunately, triglycerides can sometimes be controlled with several diet and lifestyle changes.    What Factors Can Increase Triglycerides? As with cholesterol, eating too much of the wrong kinds of fats will raise your blood triglycerides.  Therefore, it's important to restrict the amounts of saturated fats and trans fats you allow into your diet.  Triglyceride levels can also shoot up after eating foods that are high in carbohydrates or after drinking alcohol.  That's why triglyceride blood tests require an overnight fast.  If you have elevated triglycerides, it's especially important to avoid sugary and refined carbohydrates, including sugar, honey, and other sweeteners, soda and other sugary drinks, candy, baked goods, and anything made with white (refined or enriched) flour, including white bread, rolls, cereals, buns, pastries, regular pasta, and white rice.  You'll also want to limit dried  fruit and fruit juice since they're dense in simple sugar.  All of these low-quality carbs cause a sudden rise in insulin, which may lead to a spike in triglycerides.  Triglycerides can also become elevated as a reaction to having diabetes, hypothyroidism, or kidney disease. As with most other heart-related factors, being overweight  and inactive also contribute to abnormal triglycerides. And unfortunately, some people have a genetic predisposition that causes them to manufacture way too much triglycerides on their own, no matter how carefully they eat.     How Can You Lower Your Triglyceride Levels? If you are diagnosed with high triglycerides, it's important to take action. There are several things you can do to help lower your triglyceride levels and improve your heart health:  --> Lose weight if you are overweight.  There is a clear correlation between obesity and high triglycerides - the heavier people are, the higher their triglyceride levels are likely to be. The good news is that losing weight can significantly lower triglycerides. In a large study of individuals with type 2 diabetes, those assigned to the "lifestyle intervention group" - which involved counseling, a low-calorie meal plan, and customized exercise program - lost 8.6% of their body weight and lowered their triglyceride levels by more than 16%. If you're overweight, find a weight loss plan that works for you and commit to shedding the pounds and getting healthier.  --> Reduce the amount of saturated fat and trans fat in your diet.  Start by avoiding or dramatically limiting butter, cream cheese, lard, sour cream, doughnuts, cakes, cookies, candy bars, regular ice cream, fried foods, pizza, cheese sauce, cream-based sauces and salad dressings, high-fat meats (including fatty hamburgers, bologna, pepperoni, sausage, bacon, salami, pastrami, spareribs, and hot dogs), high-fat cuts of beef and pork, and whole-milk dairy products.   Other ways to cut back: Choose lean meats only (including skinless chicken and [place], lean beef, lean pork), fish, and reduced-fat or fat-free dairy products.   Experiment with adding whole soy foods to your diet. Although soy itself may not reduce risk of heart disease, it replaces hazardous animal fats with healthier proteins.  Choose high-quality soy foods, such as tofu, tempeh, soy milk, and edamame (whole soybeans).  Always remove skin from poultry.  Prepare foods by baking, roasting, broiling, boiling, poaching, steaming, grilling, or stir-frying in vegetable oil.  Most stick margarines contain trans fats, and trans fats are also found in some packaged baked goods, potato chips, snack foods, fried foods, and fast food that use or create hydrogenated oils.    (All food labels must now list the amount of trans fats, right after the amount of saturated fats - good news for consumers. As a result, many food companies have now reformulated their products to be trans fat free.many, but not all! So it's still just as important to read labels and make sure the packaged foods you buy don't contain trans fats.)     If you use margarine, purchase soft-tub margarine spreads that contain 0 grams trans fats and don't list any partially hydrogenated oils in the ingredients list. By substituting olive oil or vegetable oil for trans fats in just 2 percent of your daily calories, you can reduce your risk of heart disease by 53 percent.   There is no safe amount of trans fats, so try to keep them as far from your plate as possible.  -->  Avoid foods that are concentrated in sugar (even dried fruit and fruit juice). Sugary foods can  elevate triglyceride levels in the blood, so keep them to a bare minimum.  --> Swap out refined carbohydrates for whole grains.  Refined carbohydrates - like white rice, regular pasta, and anything made with white or "enriched" flour (including white bread, rolls, cereals, buns, and crackers) - raise blood sugar and insulin levels more than fiber-rich whole grains. Higher insulin levels, in turn, can lead to a higher rise in triglycerides after a meal. So, make the switch to whole wheat bread, whole grain pasta, brown or wild rice, and whole grain versions of cereals, crackers, and other bread products. However,  it's important to know that individuals with high triglycerides should moderate even their intake of high-quality starches (since all starches raise blood sugar) - I recommend 1 to 2 servings per meal.  --> Cut way back on alcohol.  If you have high triglycerides, alcohol should be considered a rare treat - if you indulge at all, since even small amounts of alcohol can dramatically increase triglyceride levels.  --> Incorporate omega-3 fats.  Heart-healthy fish oils are especially rich in omega-3 fatty acids. In multiple studies over the past two decades, people who ate diets high in omega-3s had 30 to 40 percent reductions in heart disease. Although we don't yet know why fish oil works so well, there are several possibilities. Omega-3s seem to reduce inflammation, reduce high blood pressure, decrease triglycerides, raise HDL cholesterol, and make blood thinner and less sticky so it is less likely to clot. It's as close to a food prescription for heart health as it gets. If you have high triglycerides, I recommend eating at least three servings of one of the omega-3-rich fish every week (fatty fish is the most concentrated food form of omega three fats). If you cannot manage to eat that much fish, speak with your physician about taking fish oil capsules, which offer similar benefits.The best foods for omega-3 fatty acids include wild salmon (fresh, canned), herring, mackerel (not king), sardines, anchovies, rainbow trout, and Pacific oysters. Non-fish sources of omega-3 fats include omega-3-fortified eggs, ground flaxseed, chia seeds, walnuts, butternuts (white walnuts), seaweed, walnut oil, canola oil, and soybeans.  --> Quit smoking.  Smoking causes inflammation, not just in your lungs, but throughout your body. Inflammation can contribute to atherosclerosis, blood clots, and risk of heart attack. Smoking makes all heart health indicators worse. If you have high cholesterol, high triglycerides, or high  blood pressure, smoking magnifies the danger.  --> Become more physically active.  Even moderate exercise can help improve cholesterol, triglycerides, and blood pressure. Aerobic exercise seems to be able to stop the sharp rise of triglycerides after eating, perhaps because of a decrease in the amount of triglyceride released by the liver, or because active muscle clears triglycerides out of the blood stream more quickly than inactive muscle. If you haven't exercised regularly (or at all) for years, I recommend starting slowly, by walking at an easy pace for 15 minutes a day. Then, as you feel more comfortable, increase the amount. Your ultimate goal should be at least 30 minutes of moderate physical activity, at least five days a week.   Restless Legs Syndrome Restless legs syndrome is a condition that causes uncomfortable feelings or sensations in the legs, especially while sitting or lying down. The sensations usually cause an overwhelming urge to move the legs. The arms can also sometimes be affected. The condition can range from mild to severe. The symptoms often interfere with a person's ability to sleep. What are the  causes? The cause of this condition is not known. What increases the risk? This condition is more likely to develop in:  People who are older than age 29.  Pregnant women. In general, restless legs syndrome is more common in women than in men.  People who have a family history of the condition.  People who have certain medical conditions, such as iron deficiency, kidney disease, Parkinson disease, or nerve damage.  People who take certain medicines, such as medicines for high blood pressure, nausea, colds, allergies, depression, and some heart conditions. What are the signs or symptoms? The main symptom of this condition is uncomfortable sensations in the legs. These sensations may be:  Described as pulling, tingling, prickling, throbbing, crawling, or burning.  Worse  while you are sitting or lying down.  Worse during periods of rest or inactivity.  Worse at night, often interfering with your sleep.  Accompanied by a very strong urge to move your legs.  Temporarily relieved by movement of your legs. The sensations usually affect both sides of the body. The arms can also be affected, but this is rare. People who have this condition often have tiredness during the day because of their lack of sleep at night. How is this diagnosed? This condition may be diagnosed based on your description of the symptoms. You may also have tests, including blood tests, to check for other conditions that may lead to your symptoms. In some cases, you may be asked to spend some time in a sleep lab so your sleeping can be monitored. How is this treated? Treatment for this condition is focused on managing the symptoms. Treatment may include:  Self-help and lifestyle changes.  Medicines. Follow these instructions at home:  Take medicines only as directed by your health care provider.  Try these methods to get temporary relief from the uncomfortable sensations:  Massage your legs.  Walk or stretch.  Take a cold or hot bath.  Practice good sleep habits. For example, go to bed and get up at the same time every day.  Exercise regularly.  Practice ways of relaxing, such as yoga or meditation.  Avoid caffeine and alcohol.  Do not use any tobacco products, including cigarettes, chewing tobacco, or electronic cigarettes. If you need help quitting, ask your health care provider.  Keep all follow-up visits as directed by your health care provider. This is important. Contact a health care provider if: Your symptoms do not improve with treatment, or they get worse. This information is not intended to replace advice given to you by your health care provider. Make sure you discuss any questions you have with your health care provider. Document Released: 06/14/2002 Document  Revised: 11/30/2015 Document Reviewed: 06/20/2014 Elsevier Interactive Patient Education  2017 [employer].

## 2016-11-07 NOTE — Assessment & Plan Note (Signed)
a1c stable  Pre-DM counseling done  Wt loss

## 2016-11-07 NOTE — Assessment & Plan Note (Signed)
Inc requip per pt request  Cut back etoh  Inc water intake to 1/2 wt in ounces water per day  Exercise

## 2016-11-07 NOTE — Assessment & Plan Note (Addendum)
Add Zetia to help with LDL levels.   Counseling done regarding disease process and various treatment options.  All questions answered  Handouts given if patient desired them

## 2016-11-07 NOTE — Assessment & Plan Note (Addendum)
-  Since multi-factorial reasons for seeing Urology ( BPH, ED, Low T txmnt with secondary polycythemia effects of drug etc-- Needs to cont to get care from them.  - PSA- stable and very low

## 2016-11-07 NOTE — Assessment & Plan Note (Addendum)
Get me medical records from Urology please  T injections by Urology, injections every 2 weeks. Management per them.

## 2016-11-07 NOTE — Assessment & Plan Note (Signed)
Cbc recently - stable

## 2016-12-02 ENCOUNTER — Encounter: Payer: Self-pay | Admitting: Family Medicine

## 2016-12-05 ENCOUNTER — Encounter: Payer: Self-pay | Admitting: Family Medicine

## 2016-12-09 MED ORDER — ROPINIROLE HCL ER 2 MG PO TB24: 2.0000 mg | ORAL_TABLET | Freq: Every day | ORAL | 1 refills | Status: DC

## 2016-12-12 ENCOUNTER — Ambulatory Visit (INDEPENDENT_AMBULATORY_CARE_PROVIDER_SITE_OTHER): Payer: Medicare HMO | Admitting: Family Medicine

## 2016-12-12 ENCOUNTER — Encounter: Payer: Self-pay | Admitting: Family Medicine

## 2016-12-12 VITALS — BP 116/74 | HR 93 | Temp 98.9°F | Resp 20 | Ht 70.5 in | Wt 227.0 lb

## 2016-12-12 DIAGNOSIS — G2581 Restless legs syndrome: Secondary | ICD-10-CM

## 2016-12-12 DIAGNOSIS — B9789 Other viral agents as the cause of diseases classified elsewhere: Secondary | ICD-10-CM | POA: Diagnosis not present

## 2016-12-12 DIAGNOSIS — R197 Diarrhea, unspecified: Secondary | ICD-10-CM | POA: Diagnosis not present

## 2016-12-12 DIAGNOSIS — J069 Acute upper respiratory infection, unspecified: Secondary | ICD-10-CM

## 2016-12-12 MED ORDER — ROPINIROLE HCL ER 2 MG PO TB24: 2.0000 mg | ORAL_TABLET | Freq: Two times a day (BID) | ORAL | 1 refills | Status: DC

## 2016-12-12 MED ORDER — METHYLPREDNISOLONE SODIUM SUCC 125 MG IJ SOLR
125.0000 mg | Freq: Once | INTRAMUSCULAR | Status: AC
  Administered 2016-12-12: 125 mg via INTRAMUSCULAR

## 2016-12-12 NOTE — Progress Notes (Signed)
Acute Care Office visit  Assessment and plan:  No diagnosis found.   Anticipatory guidance and routine counseling done re: condition, txmnt options and need for follow up. All questions of patient's were answered.  - Viral vs Allergic vs Bacterial causes for pt's symptoms reveiwed.    - Supportive care and various OTC medications discussed in addition to any prescribed. - Call or RTC if new symptoms, or if no improvement or worse over next couple days.   - Will consider ABX at that time if sx continue past 5-7 days and worsening.    Meds ordered this encounter  Medications  . Phenylephrine-Pheniramine-DM (THERAFLU COLD & COUGH PO)    Sig: Take by mouth as directed.  . bismuth subsalicylate (PEPTO BISMOL) 262 MG/15ML suspension    Sig: Take 30 mLs by mouth every 6 (six) hours as needed.     Discontinued Medications   No medications on file      No orders of the defined types were placed in this encounter.    Gross side effects, risk and benefits, and alternatives of medications discussed with patient.  Patient is aware that all medications have potential side effects and we are unable to predict every sideeffect or drug-drug interaction that may occur.  Expresses verbal understanding and consents to current therapy plan and treatment regiment.  No Follow-up on file.  Please see AVS handed out to patient at the end of our visit for additional patient instructions/ counseling done pertaining to today's office visit.  Note: This document was prepared using Dragon voice recognition software and may include unintentional dictation errors.    Subjective:    Chief Complaint  Patient presents with  . URI    3 days  . Diarrhea    HPI:  Pt presents with URI sx for 3 days.      Wife came back from [place] and was exposed to grandchildren who were all sick.  She had a illness for approximate 5-7 days including the diarrhea ( now better) and now patient contracted it.   C/o rhinorrhea, ST, and dry hacking cough, stuffy head-  GI sx started yesterday--> couple of loose stools- mostly watery last ngiht and this am.  NO N/V.   NO F/C,  No SOB, DIB, no wh.     Denies objective F/C, No Rash, no tick bites, no one-sided face or ear pain.     taken anything for sx- theraflu for head and pepto for loose stools.     Overall getting better each day--> yesterday was good.    He is doing well with the Requip XL formula but has been having to take it twice daily at 4 PM and at bedtime.  That works very well to control his symptoms.  He is requesting a new prescription and a 90 day supply be sent to mail order.  He is not having any side effects    Patient Active Problem List   Diagnosis Date Noted  . Hypertriglyceridemia 06/13/2016    Priority: High  . Hyperlipidemia, mixed 03/30/2016    Priority: High  . Hyperlipidemia type III 08/25/2013    Priority: High  . Restless leg syndrome 04/20/2013    Priority: High  . h/o Elevated blood sugar/ Pre-Diabetes 04/20/2013    Priority: High  . Essential hypertension 06/09/2008    Priority: High  . Benign prostatic hyperplasia with nocturia 06/13/2016    Priority: Medium  . Obese- esp abdomen 02/29/2016    Priority:  Medium  . GERD 04/10/2007    Priority: Medium  . Polycythemia, secondary to T injections  03/30/2016    Priority: Low  . Chronic leukopenia 03/29/2016    Priority: Low  . Hypogonadism in male / Low T 02/29/2016    Priority: Low  . h/o Iron deficiency anemia 02/29/2016    Priority: Low  . History of tobacco use disorder- >er 60 pk yr hx 02/29/2016    Priority: Low  . Sleep difficulties 09/16/2011    Priority: Low  . Osteoarthritis 06/09/2008    Priority: Low  . ERECTILE DYSFUNCTION, MILD 05/14/2007    Priority: Low  . Chronic increased urinary frequency 06/13/2016  . Snoring 10/22/2011  . SPONDYLITIS, ANKYLOSING 05/14/2007  . ALLERGIC RHINITIS 04/10/2007    Past medical history, Surgical  history, Family history reviewed and noted below, Social history, Allergies, and Medications have been entered into the medical record, reviewed and changed as needed.   Allergies  Allergen Reactions  . Sulfonamide Derivatives     Review of Systems: General:   No F/C, wt loss Pulm:   No DIB, pleuritic chest pain Card:  No CP, palpitations Abd:  No n/v/d or pain Ext:  No inc edema from baseline   Objective:   Blood pressure 116/74, pulse 93, temperature 98.9 F (37.2 C), temperature source Oral, resp. rate 20, height 5' 10.5" (1.791 m), weight 227 lb (103 kg), SpO2 92 %. Body mass index is 32.11 kg/m. General: Well Developed, well nourished, appropriate for stated age.  Neuro: Alert and oriented x3, extra-ocular muscles intact, sensation grossly intact.  HEENT: Normocephalic, atraumatic, pupils equal round reactive to light, neck supple, no masses, no painful lymphadenopathy, TM's intact B/L, no acute findings. Nares- patent, clear d/c, OP- clear, mild erythema, No TTP sinuses Skin: Warm and dry, no gross rash. Cardiac: RRR, S1 S2,  no murmurs rubs or gallops.  Respiratory: ECTA B/L and A/P, Not using accessory muscles, speaking in full sentences- unlabored. Vascular:  No gross lower ext edema, cap RF less 2 sec. Psych: No HI/SI, judgement and insight good, Euthymic mood. Full Affect.   Patient Care Team    Relationship Specialty Notifications Start End  Mellody Dance, DO PCP - General Family Medicine  04/30/16   Pieter Partridge, DO Consulting Physician Neurology  02/29/16    Comment: RLS,   Rana Snare, MD Consulting Physician Urology  02/29/16

## 2016-12-12 NOTE — Patient Instructions (Addendum)
Enterovirus- Viral upper resp infection  Enterovirus D68 is a common virus that causes a fever, cough, and nasal congestion (upper respiratory infection). What are the causes? Enterovirus D68 spreads from person to person through coughing and sneezing. The virus is present in the fluid of an infected person's lungs (upper respiratory secretions). When a sick person coughs or sneezes, particles get released into the air. You can get infected by breathing in those particles. The virus may also be on any surface where these particles land. You can get infected by touching that surface and then touching your nose or mouth. What increases the risk? This condition is more likely to develop in:  People who have a chronic disease, such as chronic obstructive pulmonary disease (COPD), asthma, congestive heart failure, cystic fibrosis, or diabetes.  People who have a weakened immune system (are immunocompromised).  What are the signs or symptoms? For most adults, symptoms of this condition are mild. Symptoms include:  Fever.  Nasal congestion.  Sneezing.  Muscle aches.  Cough.  Wheezing.  Trouble breathing.  How is this diagnosed? This condition is diagnosed based on your symptoms and a physical exam. You may also have a chest X-ray. How is this treated? There is no treatment or vaccine for this infection. Most people recover after resting at home for several days. Follow these instructions at home:  Take over-the-counter and prescription medicines only as told by your health care provider.  Rest at home until symptoms go away. Do not go to work while you have symptoms.  Wash your hands often with soap and water for at least 20 seconds. If soap and water are not available, use hand sanitizer.  Drink enough fluid to keep your urine clear or pale yellow.  Keep all follow-up visits as told by your health care provider. This is important. Contact a health care provider if:  You  have a fever.  You have nausea or vomiting.  Your symptoms last longer than 3 days. Get help right away if:  You develop wheezing.  You have trouble breathing.  You cannot keep fluids down. This information is not intended to replace advice given to you by your health care provider. Make sure you discuss any questions you have with your health care provider. Document Released: 06/29/2013 Document Revised: 01/12/2016 Document Reviewed: 10/12/2015 Elsevier Interactive Patient Education  2018 [employer].    Cough, Adult Coughing is a reflex that clears your throat and your airways. Coughing helps to heal and protect your lungs. It is normal to cough occasionally, but a cough that happens with other symptoms or lasts a long time may be a sign of a condition that needs treatment. A cough may last only 2-3 weeks (acute), or it may last longer than 8 weeks (chronic). What are the causes? Coughing is commonly caused by:  Breathing in substances that irritate your lungs.  A viral or bacterial respiratory infection.  Allergies.  Asthma.  Postnasal drip.  Smoking.  Acid backing up from the stomach into the esophagus (gastroesophageal reflux).  Certain medicines.  Chronic lung problems, including COPD (or rarely, lung cancer).  Other medical conditions such as heart failure.  Follow these instructions at home: Pay attention to any changes in your symptoms. Take these actions to help with your discomfort:  Take medicines only as told by your health care provider. ? If you were prescribed an antibiotic medicine, take it as told by your health care provider. Do not stop taking the  antibiotic even if you start to feel better. ? Talk with your health care provider before you take a cough suppressant medicine.  Drink enough fluid to keep your urine clear or pale yellow.  If the air is dry, use a cold steam vaporizer or humidifier in your bedroom or your home to help loosen  secretions.  Avoid anything that causes you to cough at work or at home.  If your cough is worse at night, try sleeping in a semi-upright position.  Avoid cigarette smoke. If you smoke, quit smoking. If you need help quitting, ask your health care provider.  Avoid caffeine.  Avoid alcohol.  Rest as needed.  Contact a health care provider if:  You have new symptoms.  You cough up pus.  Your cough does not get better after 2-3 weeks, or your cough gets worse.  You cannot control your cough with suppressant medicines and you are losing sleep.  You develop pain that is getting worse or pain that is not controlled with pain medicines.  You have a fever.  You have unexplained weight loss.  You have night sweats. Get help right away if:  You cough up blood.  You have difficulty breathing.  Your heartbeat is very fast. This information is not intended to replace advice given to you by your health care provider. Make sure you discuss any questions you have with your health care provider. Document Released: 12/21/2010 Document Revised: 11/30/2015 Document Reviewed: 08/31/2014 Elsevier Interactive Patient Education  2017 [employer].

## 2016-12-16 ENCOUNTER — Telehealth: Payer: Self-pay

## 2016-12-16 NOTE — Telephone Encounter (Signed)
Prior authorization for patients requip.  Done via cover my meds.  Medication approved.

## 2016-12-26 ENCOUNTER — Ambulatory Visit (INDEPENDENT_AMBULATORY_CARE_PROVIDER_SITE_OTHER): Payer: Medicare HMO | Admitting: Adult Health

## 2016-12-26 ENCOUNTER — Encounter: Payer: Self-pay | Admitting: Adult Health

## 2016-12-26 DIAGNOSIS — L740 Miliaria rubra: Secondary | ICD-10-CM | POA: Diagnosis not present

## 2016-12-26 NOTE — Assessment & Plan Note (Signed)
Heat Rash Handout provided. Advised to limit exposure outside for prolonged periods. OTC Corticosteriod cream-I.e. Cortizone, Aveeno as directed by [employer]. If symptoms do not improve by Monday, please call clinic. If any signs of infection (i.e. Excessive heat, drainage, fever, night sweats, poor appetite) develop please call clinic immediately.

## 2016-12-26 NOTE — Progress Notes (Signed)
Subjective:    Patient ID: Terry Rogers, male    DOB: January 27, 1941, 76 y.o.   MRN: [id number]  HPI:  Terry Rogers presents with red/itchy rash bil axillary areas and bil inner thighs.  Rash developed yesterday and it not painful, just "itchy".  He denies fever/night sweats/poor appetite/drainage from areas.  He plays golf 4-5 times a week and estimates to be on course > 4 hrs when playing.  He walks the course with his bad and reports "sweating A LOT".  He is well hydrated and denies dizziness or hx of heat stroke.  He reports that this has occurred in the past and he is only here today "b/c my wife wants to make sure it isn't infected".  He has not applied any creams to areas of rash.  Patient Care Team    Relationship Specialty Notifications Start End  Mellody Dance, DO PCP - General Family Medicine  04/30/16   Pieter Partridge, DO Consulting Physician Neurology  02/29/16    Comment: RLS,   Rana Snare, MD Consulting Physician Urology  02/29/16     Patient Active Problem List   Diagnosis Date Noted  . Heat rash 12/26/2016  . Hypertriglyceridemia 06/13/2016  . Benign prostatic hyperplasia with nocturia 06/13/2016  . Chronic increased urinary frequency 06/13/2016  . Hyperlipidemia, mixed 03/30/2016  . Polycythemia, secondary to T injections  03/30/2016  . Chronic leukopenia 03/29/2016  . Hypogonadism in male / Low T 02/29/2016  . h/o Iron deficiency anemia 02/29/2016  . History of tobacco use disorder- >er 60 pk yr hx 02/29/2016  . Obese- esp abdomen 02/29/2016  . Hyperlipidemia type III 08/25/2013  . Restless leg syndrome 04/20/2013  . h/o Elevated blood sugar/ Pre-Diabetes 04/20/2013  . Snoring 10/22/2011  . Sleep difficulties 09/16/2011  . Essential hypertension 06/09/2008  . Osteoarthritis 06/09/2008  . ERECTILE DYSFUNCTION, MILD 05/14/2007  . SPONDYLITIS, ANKYLOSING 05/14/2007  . ALLERGIC RHINITIS 04/10/2007  . GERD 04/10/2007     Past Medical History:  Diagnosis Date    . ED (erectile dysfunction)   . GERD (gastroesophageal reflux disease)   . Hyperlipidemia   . Hypertension   . Kidney stone   . Osteoarthritis   . Ulcer      Past Surgical History:  Procedure Laterality Date  . COLONOSCOPY  12/28/2003   6 hyperplastic polyps [name])  . ROTATOR CUFF REPAIR  2009   bilateral      Family History  Problem Relation Age of Onset  . Stroke Mother   . Alzheimer's disease Father   . Heart failure Maternal Grandmother   . Heart failure Maternal Grandfather   . Alzheimer's disease Paternal Grandmother      History  Drug Use No     History  Alcohol Use  . 1.2 oz/week  . 2 Glasses of wine per week    Comment: a day      History  Smoking Status  . Former Smoker  . Packs/day: 1.50  . Years: 50.00  . Types: Cigarettes  . Quit date: 10/22/2006  Smokeless Tobacco  . Never Used    Comment: Pt uses Nicorette Gum currently     Outpatient Encounter Prescriptions as of 12/26/2016  Medication Sig Note  . atenolol (TENORMIN) 50 MG tablet Take 1 tablet (50 mg total) by mouth daily.   . B-D 3CC LUER-LOK SYR 21GX1" 21G X 1" 3 ML MISC See admin instructions. 04/19/2015: Received from: External Pharmacy  . bismuth subsalicylate (PEPTO BISMOL) 262 MG/15ML  suspension Take 30 mLs by mouth every 6 (six) hours as needed.   . ezetimibe (ZETIA) 10 MG tablet Take 1 tablet (10 mg total) by mouth daily.   . fenofibrate (TRICOR) 145 MG tablet Take 1 tablet (145 mg total) by mouth at bedtime.   . hydrocortisone (ANUSOL-HC) 25 MG suppository PLACE 1 SUPPOSITORY (25 MG TOTAL) RECTALLY 2 (TWO) TIMES DAILY. (Patient taking differently: PLACE 1 SUPPOSITORY (25 MG TOTAL) RECTALLY 2 (TWO) TIMES DAILY. as needed)   . Multiple Vitamins-Minerals (CENTRUM SILVER ULTRA MENS PO) Take by mouth.   . Omega-3 Fatty Acids (FISH OIL) 1200 MG CAPS Take 1 capsule by mouth daily.   Marland Kitchen rOPINIRole (REQUIP XL) 2 MG 24 hr tablet Take 1 tablet (2 mg total) by mouth 2 (two) times daily.    Marland Kitchen testosterone cypionate (DEPOTESTOSTERONE CYPIONATE) 200 MG/ML injection Inject 200 mg into the muscle every 28 (twenty-eight) days.   Marland Kitchen triamcinolone cream (KENALOG) 0.1 % APPLY 1 APPLICATION        TOPICALLY TWO TIMES A DAY   . [DISCONTINUED] Phenylephrine-Pheniramine-DM (THERAFLU COLD & COUGH PO) Take by mouth as directed.    No facility-administered encounter medications on file as of 12/26/2016.     Allergies: Sulfonamide derivatives  Body mass index is 32.07 kg/m.  Blood pressure 126/79, pulse 98, height 5' 10.5" (1.791 m), weight 226 lb 11.2 oz (102.8 kg).     Review of Systems  Constitutional: Negative for activity change, appetite change, chills, diaphoresis, fatigue, fever and unexpected weight change.  Respiratory: Negative for cough, chest tightness, shortness of breath, wheezing and stridor.   Cardiovascular: Negative for chest pain, palpitations and leg swelling.  Gastrointestinal: Negative for abdominal distention, abdominal pain, diarrhea, nausea and vomiting.  Endocrine: Negative for cold intolerance, heat intolerance, polydipsia, polyphagia and polyuria.  Skin: Positive for color change and rash. Negative for pallor and wound.  Allergic/Immunologic: Negative for immunocompromised state.  Neurological: Negative for dizziness, light-headedness and headaches.  Hematological: Does not bruise/bleed easily.       Objective:   Physical Exam  Constitutional: He is oriented to person, place, and time. He appears well-developed and well-nourished. No distress.  HENT:  Head: Normocephalic and atraumatic.  Right Ear: External ear normal.  Left Ear: External ear normal.  Bil hearing aids  Eyes: Conjunctivae are normal. Pupils are equal, round, and reactive to light.  Neurological: He is alert and oriented to person, place, and time. Coordination normal.  Skin: Skin is warm and dry. Rash noted. Rash is maculopapular. He is not diaphoretic. There is erythema. No pallor.      No open tissue or drainage noted. No excessive warmth or streaking noted.   Psychiatric: He has a normal mood and affect. His behavior is normal. Judgment and thought content normal.  Nursing note and vitals reviewed.         Assessment & Plan:   1. Heat rash     Heat rash Heat Rash Handout provided. Advised to limit exposure outside for prolonged periods. OTC Corticosteriod cream-I.e. Cortizone, Aveeno as directed by [employer]. If symptoms do not improve by Monday, please call clinic. If any signs of infection (i.e. Excessive heat, drainage, fever, night sweats, poor appetite) develop please call clinic immediately.     FOLLOW-UP:  Return if symptoms worsen or fail to improve.

## 2016-12-26 NOTE — Patient Instructions (Signed)
Heat Rash, Adult Heat rash is an itchy rash of little red bumps that often occurs during hot, humid weather. Heat rash is also called prickly heat or miliaria. Heat rash usually affects:  Armpits.  Elbows.  Groin.  Neck.  The area underneath the breasts.  Shoulders.  Chest.  What are the causes? This condition is caused by blocked sweat ducts. When sweat is trapped under the skin, it spreads into surrounding tissues and causes a rash of red bumps. What increases the risk? This condition is more likely to develop in people who:  Are overdressed in hot, humid weather.  Wear clothing that rubs against the skin.  Are active in hot, humid weather.  Sweat a lot.  Are not used to hot, humid weather.  What are the signs or symptoms? Symptoms of this condition include:  Small red bumps that are itchy or prickly.  Very little sweating or no sweating in the affected area.  How is this diagnosed? This condition is diagnosed based on your symptoms and medical history, as well as a physical exam. How is this treated? Moving to a cool, dry place is the best treatment for heat rash. Treatment may also include medicines, such as:  Corticosteroid creams for skin irritation.  Antibiotic medicines, if the rash becomes infected.  Follow these instructions at home: Skin care  Keep the affected area dry.  Do not apply ointments or creams that contain mineral oil or petroleum ingredients to your skin. These can make the condition worse.  Apply cool compresses to the affected areas.  Do not scratch your skin.  Do not take hot showers or baths. General instructions  Take over-the-counter and prescription medicines only as told by your health care provider.  If you were prescribed an antibiotic, take it as told by your health care provider. Do not stop taking it even if your condition improves.  Stay in a cool room as much as possible. Use an air conditioner or fan, if  possible.  Do not wear tight clothes. Wear comfortable, loose-fitting clothing.  Keep all follow-up visits as told by your health care provider. This is important. Contact a health care provider if:  You have a fever.  Your rash does not go away after 3-4 days.  Your rash gets worse or it is very itchy.  Your rash has pus or fluid coming from it. Get help right away if:  You are dizzy or nauseated.  You feel confused.  You have trouble breathing.  You have chest pain.  You have muscle cramps or contractions.  You faint. Summary  Heat rash is an itchy rash of little red bumps that often occurs during hot, humid weather.  Symptoms of heat rash include small red bumps that are itchy or prickly and very little or no sweating in the affected area.  This condition is diagnosed based on your symptoms and medical history, as well as a physical exam.  Moving to a cool, dry place is the best treatment for heat rash.  Do not wear tight clothes. Wear comfortable, loose-fitting clothing. This information is not intended to replace advice given to you by your health care provider. Make sure you discuss any questions you have with your health care provider. Document Released: 06/12/2009 Document Revised: 09/04/2016 Document Reviewed: 09/04/2016 Elsevier Interactive Patient Education  2018 [place] cream-I.e. Cortizone, Aveeno as directed by [employer]. If symptoms do not improve by Monday, please call clinic. If any signs of infection (  i.e. Excessive heat, drainage, fever, night sweats, poor appetite) develop please call clinic immediately.

## 2017-02-02 ENCOUNTER — Encounter: Payer: Self-pay | Admitting: Family Medicine

## 2017-02-03 ENCOUNTER — Encounter: Payer: Self-pay | Admitting: Family Medicine

## 2017-02-03 MED ORDER — ATENOLOL 50 MG PO TABS: 50.0000 mg | ORAL_TABLET | Freq: Every day | ORAL | 0 refills | Status: DC

## 2017-03-20 ENCOUNTER — Encounter: Payer: Self-pay | Admitting: Family Medicine

## 2017-03-20 ENCOUNTER — Ambulatory Visit (INDEPENDENT_AMBULATORY_CARE_PROVIDER_SITE_OTHER): Payer: Medicare HMO | Admitting: Family Medicine

## 2017-03-20 VITALS — BP 140/86 | HR 62 | Ht 70.5 in | Wt 231.9 lb

## 2017-03-20 DIAGNOSIS — K219 Gastro-esophageal reflux disease without esophagitis: Secondary | ICD-10-CM

## 2017-03-20 DIAGNOSIS — E781 Pure hyperglyceridemia: Secondary | ICD-10-CM

## 2017-03-20 DIAGNOSIS — R739 Hyperglycemia, unspecified: Secondary | ICD-10-CM

## 2017-03-20 DIAGNOSIS — G2581 Restless legs syndrome: Secondary | ICD-10-CM

## 2017-03-20 DIAGNOSIS — I10 Essential (primary) hypertension: Secondary | ICD-10-CM

## 2017-03-20 DIAGNOSIS — R109 Unspecified abdominal pain: Secondary | ICD-10-CM

## 2017-03-20 DIAGNOSIS — R141 Gas pain: Secondary | ICD-10-CM

## 2017-03-20 DIAGNOSIS — E782 Mixed hyperlipidemia: Secondary | ICD-10-CM

## 2017-03-20 NOTE — Patient Instructions (Addendum)
So you can try a stool softener OTC.  This will help you move your bowels along.  Make sure you're drinking adequate amounts of water which is one half of your weight in ounces of water per day.  Avoid caffeine products as well as dairy products which tend to produce more gas.  If there is a particular food in unit produces gas such as broccoli, beans or something like that treat please try to avoid those foods.   He can take Mylanta or Maalox as needed.  If any of her symptoms change or worsen please let me know.    Constipation, Adult Constipation is when a person has fewer bowel movements in a week than normal, has difficulty having a bowel movement, or has stools that are dry, hard, or larger than normal. Constipation may be caused by an underlying condition. It may become worse with age if a person takes certain medicines and does not take in enough fluids. Follow these instructions at home: Eating and drinking   Eat foods that have a lot of fiber, such as fresh fruits and vegetables, whole grains, and beans.  Limit foods that are high in fat, low in fiber, or overly processed, such as french fries, hamburgers, cookies, candies, and soda.  Drink enough fluid to keep your urine clear or pale yellow. General instructions  Exercise regularly or as told by your health care provider.  Go to the restroom when you have the urge to go. Do not hold it in.  Take over-the-counter and prescription medicines only as told by your health care provider. These include any fiber supplements.  Practice pelvic floor retraining exercises, such as deep breathing while relaxing the lower abdomen and pelvic floor relaxation during bowel movements.  Watch your condition for any changes.  Keep all follow-up visits as told by your health care provider. This is important. Contact a health care provider if:  You have pain that gets worse.  You have a fever.  You do not have a bowel movement after 4  days.  You vomit.  You are not hungry.  You lose weight.  You are bleeding from the anus.  You have thin, pencil-like stools. Get help right away if:  You have a fever and your symptoms suddenly get worse.  You leak stool or have blood in your stool.  Your abdomen is bloated.  You have severe pain in your abdomen.  You feel dizzy or you faint. This information is not intended to replace advice given to you by your health care provider. Make sure you discuss any questions you have with your health care provider. Document Released: 03/22/2004 Document Revised: 01/12/2016 Document Reviewed: 12/13/2015 Elsevier Interactive Patient Education  2017 [employer].

## 2017-03-20 NOTE — Progress Notes (Signed)
Impression and Recommendations:    1. Essential hypertension   2. h/o Elevated blood sugar/ Pre-Diabetes   3. Hyperlipidemia, mixed   4. Hypertriglyceridemia   5. Restless leg syndrome   6. Gastroesophageal reflux disease, esophagitis presence not specified   7. Stomach ache   8. Abdominal gas pain     Essential hypertension  h/o Elevated blood sugar/ Pre-Diabetes  Hyperlipidemia, mixed  Hypertriglyceridemia  Restless leg syndrome  Gastroesophageal reflux disease, esophagitis presence not specified  Stomach ache  Abdominal gas pain  No problem-specific Assessment & Plan notes found for this encounter.   The patient was counseled, risk factors were discussed, anticipatory guidance given.   New Prescriptions   No medications on file     Discontinued Medications   No medications on file     Modified Medications   No medications on file      No orders of the defined types were placed in this encounter.    Gross side effects, risk and benefits, and alternatives of medications and treatment plan in general discussed with patient.  Patient is aware that all medications have potential side effects and we are unable to predict every side effect or drug-drug interaction that may occur.   Patient will call with any questions prior to using medication if they have concerns.  Expresses verbal understanding and consents to current therapy and treatment regimen.  No barriers to understanding were identified.  Red flag symptoms and signs discussed in detail.  Patient expressed understanding regarding what to do in case of emergency\urgent symptoms  Please see AVS handed out to patient at the end of our visit for further patient instructions/ counseling done pertaining to today's office visit.   Return for Follow-up near future for your physical, and fasting.     Note: This document was prepared using Dragon voice recognition software and may include  unintentional dictation errors.  Theador Jezewski 9:15 AM --------------------------------------------------------------------------------------------------------------------------------------------------------------------------------------------------------------------------------------------    Subjective:    CC:  Chief Complaint  Patient presents with  . Follow-up    HPI: Terry Rogers is a 76 y.o. male who presents to [facility] today for issues as discussed below.  Prostate cancer- new dx-- bx recently about 3 wks ago-- results 2 wks ago.    BP- no problems, no concerns, taking meds.  Golfing 3-4 d/ week.  Walking.  Walks dog daily for 41min.    CC:  Stomach ache for 4 days.  BM- N.  Just a mild ache- now improving since prior.   B/l lower abd.  No cramping, no sharp pains- just "an ache".  no D, N,V, no constipation.    A little etoh- couple glasses wine per night- chardonney  Restless leg syndrome.  Ropinirole XL working very well in helping control symptoms throughout the night.  Patient does state it is expensive though.  Prediabetes:  Patient's weight is up a little from prior about 5 pounds.  He has been drinking his Chardonnay wine nightly 2-3 glasses.  He has not been checking his sugars or eating a low carb diet.    Wt Readings from Last 3 Encounters:  03/20/17 231 lb 14.4 oz (105.2 kg)  12/26/16 226 lb 11.2 oz (102.8 kg)  12/12/16 227 lb (103 kg)   BP Readings from Last 3 Encounters:  03/20/17 140/86  12/26/16 126/79  12/12/16 116/74   Pulse Readings from Last 3 Encounters:  03/20/17 62  12/26/16 98  12/12/16 93   BMI Readings from Last 3 Encounters:  03/20/17 32.80 kg/m  12/26/16 32.07 kg/m  12/12/16 32.11 kg/m     Patient Care Team    Relationship Specialty Notifications Start End  Mellody Dance, DO PCP - General Family Medicine  04/30/16   Pieter Partridge, DO Consulting Physician Neurology  02/29/16     Comment: RLS,   Rana Snare, MD Consulting Physician Urology  02/29/16      Patient Active Problem List   Diagnosis Date Noted  . Hypertriglyceridemia 06/13/2016    Priority: High  . Hyperlipidemia, mixed 03/30/2016    Priority: High  . Hyperlipidemia type III 08/25/2013    Priority: High  . Restless leg syndrome 04/20/2013    Priority: High  . h/o Elevated blood sugar/ Pre-Diabetes 04/20/2013    Priority: High  . Essential hypertension 06/09/2008    Priority: High  . Benign prostatic hyperplasia with nocturia 06/13/2016    Priority: Medium  . Obese- esp abdomen 02/29/2016    Priority: Medium  . GERD 04/10/2007    Priority: Medium  . Polycythemia, secondary to T injections  03/30/2016    Priority: Low  . Chronic leukopenia 03/29/2016    Priority: Low  . Hypogonadism in male / Low T 02/29/2016    Priority: Low  . h/o Iron deficiency anemia 02/29/2016    Priority: Low  . History of tobacco use disorder- >er 60 pk yr hx 02/29/2016    Priority: Low  . Sleep difficulties 09/16/2011    Priority: Low  . Osteoarthritis 06/09/2008    Priority: Low  . ERECTILE DYSFUNCTION, MILD 05/14/2007    Priority: Low  . Heat rash 12/26/2016  . Chronic increased urinary frequency 06/13/2016  . Snoring 10/22/2011  . SPONDYLITIS, ANKYLOSING 05/14/2007  . ALLERGIC RHINITIS 04/10/2007    Past Medical history, Surgical history, Family history, Social history, Allergies and Medications have been entered into the medical record, reviewed and changed as needed.    Current Meds  Medication Sig  . atenolol (TENORMIN) 50 MG tablet Take 1 tablet (50 mg total) by mouth daily.  . B-D 3CC LUER-LOK SYR 21GX1" 21G X 1" 3 ML MISC See admin instructions.  . bismuth subsalicylate (PEPTO BISMOL) 262 MG/15ML suspension Take 30 mLs by mouth every 6 (six) hours as needed.  . ezetimibe (ZETIA) 10 MG tablet Take 1 tablet (10 mg total) by mouth daily.  . fenofibrate (TRICOR) 145 MG tablet Take 1 tablet (145  mg total) by mouth at bedtime.  . hydrocortisone (ANUSOL-HC) 25 MG suppository PLACE 1 SUPPOSITORY (25 MG TOTAL) RECTALLY 2 (TWO) TIMES DAILY. (Patient taking differently: PLACE 1 SUPPOSITORY (25 MG TOTAL) RECTALLY 2 (TWO) TIMES DAILY. as needed)  . Multiple Vitamins-Minerals (CENTRUM SILVER ULTRA MENS PO) Take by mouth.  . Omega-3 Fatty Acids (FISH OIL) 1200 MG CAPS Take 1 capsule by mouth daily.  Marland Kitchen rOPINIRole (REQUIP XL) 2 MG 24 hr tablet Take 1 tablet (2 mg total) by mouth 2 (two) times daily.  Marland Kitchen testosterone cypionate (DEPOTESTOSTERONE CYPIONATE) 200 MG/ML injection Inject 200 mg into the muscle every 28 (twenty-eight) days.  Marland Kitchen triamcinolone cream (KENALOG) 0.1 % APPLY 1 APPLICATION        TOPICALLY TWO TIMES A DAY    Allergies:  Allergies  Allergen Reactions  . Sulfonamide Derivatives      Review of Systems: General:   Denies fever, chills, unexplained weight loss.  Optho/Auditory:   Denies visual changes, blurred vision/LOV Respiratory:   Denies wheeze,  DOE more than baseline levels.  Cardiovascular:   Denies chest pain, palpitations, new onset peripheral edema  Gastrointestinal:   Denies nausea, vomiting, diarrhea, abd pain.  Genitourinary: Denies dysuria, freq/ urgency, flank pain or discharge from genitals.  Endocrine:     Denies hot or cold intolerance, polyuria, polydipsia. Musculoskeletal:   Denies unexplained myalgias, joint swelling, unexplained arthralgias, gait problems.  Skin:  Denies new onset rash, suspicious lesions Neurological:     Denies dizziness, unexplained weakness, numbness  Psychiatric/Behavioral:   Denies mood changes, suicidal or homicidal ideations, hallucinations    Objective:   Blood pressure 140/86, pulse 62, height 5' 10.5" (1.791 m), weight 231 lb 14.4 oz (105.2 kg). Body mass index is 32.8 kg/m. General:  Well Developed, well nourished, appropriate for stated age.  Neuro:  Alert and oriented,  extra-ocular muscles intact  HEENT:   Normocephalic, atraumatic, neck supple, no carotid bruits appreciated  Skin:  no gross rash, warm, pink. Cardiac:  RRR, S1 S2 Respiratory:  ECTA B/L and A/P, Not using accessory muscles, speaking in full sentences- unlabored. Vascular:  Ext warm, no cyanosis apprec.; cap RF less 2 sec. Psych:  No HI/SI, judgement and insight good, Euthymic mood. Full Affect.

## 2017-04-03 ENCOUNTER — Encounter: Payer: Self-pay | Admitting: Radiation Oncology

## 2017-04-04 ENCOUNTER — Encounter: Payer: Self-pay | Admitting: Radiation Oncology

## 2017-04-16 ENCOUNTER — Ambulatory Visit: Payer: Medicare HMO

## 2017-04-16 ENCOUNTER — Ambulatory Visit: Payer: Medicare HMO | Admitting: Radiation Oncology

## 2017-04-22 NOTE — Progress Notes (Signed)
GU Location of Tumor / Histology: prostatic adenocarcinoma  If Prostate Cancer, Gleason Score is (3 + 4) and PSA is (4.28). Prostate volume: 47cc.  Cola A Jimmerson was biopsied for a PSA of 4.28 in August 2018. Patient states, "they told me my PSA has been high all along."   Biopsies of prostate (if applicable) revealed:    Past/Anticipated interventions by urology, if any: biopsy, referral to radiation oncology  Past/Anticipated interventions by medical oncology, if any: no  Weight changes, if any: no  Bowel/Bladder complaints, if any: minimal LUTS and moderate to severe ED. Denies dysuria, hematuria, leakage or incontinence.    Nausea/Vomiting, if any: no  Pain issues, if any:  no  SAFETY ISSUES:  Prior radiation? no  Pacemaker/ICD? no  Possible current pregnancy? no  Is the patient on methotrexate? no  Current Complaints / other details:  76 year old male.  Patient most interested in brachytherapy. Patient has stopped using Testerone. Patient lives in [place] between December and February. He questions if he should have procedure done before or after [place]. Maternal grandfather had prostate cancer. Enjoys golf.

## 2017-04-23 ENCOUNTER — Ambulatory Visit
Admission: RE | Admit: 2017-04-23 | Discharge: 2017-04-23 | Disposition: A | Discharge status: Home / Self Care | Payer: Medicare HMO | Source: Ambulatory Visit | Attending: Radiation Oncology | Admitting: Radiation Oncology

## 2017-04-23 ENCOUNTER — Encounter: Payer: Self-pay | Admitting: Radiation Oncology

## 2017-04-23 VITALS — Resp 18 | Ht 71.0 in | Wt 230.2 lb

## 2017-04-23 DIAGNOSIS — Z882 Allergy status to sulfonamides status: Secondary | ICD-10-CM | POA: Insufficient documentation

## 2017-04-23 DIAGNOSIS — I10 Essential (primary) hypertension: Secondary | ICD-10-CM | POA: Insufficient documentation

## 2017-04-23 DIAGNOSIS — M1990 Unspecified osteoarthritis, unspecified site: Secondary | ICD-10-CM | POA: Diagnosis not present

## 2017-04-23 DIAGNOSIS — N529 Male erectile dysfunction, unspecified: Secondary | ICD-10-CM | POA: Insufficient documentation

## 2017-04-23 DIAGNOSIS — E785 Hyperlipidemia, unspecified: Secondary | ICD-10-CM | POA: Insufficient documentation

## 2017-04-23 DIAGNOSIS — E291 Testicular hypofunction: Secondary | ICD-10-CM | POA: Insufficient documentation

## 2017-04-23 DIAGNOSIS — G2581 Restless legs syndrome: Secondary | ICD-10-CM | POA: Diagnosis not present

## 2017-04-23 DIAGNOSIS — K219 Gastro-esophageal reflux disease without esophagitis: Secondary | ICD-10-CM | POA: Diagnosis not present

## 2017-04-23 DIAGNOSIS — C61 Malignant neoplasm of prostate: Secondary | ICD-10-CM

## 2017-04-23 DIAGNOSIS — Z8042 Family history of malignant neoplasm of prostate: Secondary | ICD-10-CM | POA: Diagnosis not present

## 2017-04-23 DIAGNOSIS — Z87891 Personal history of nicotine dependence: Secondary | ICD-10-CM | POA: Insufficient documentation

## 2017-04-23 DIAGNOSIS — Z79899 Other long term (current) drug therapy: Secondary | ICD-10-CM | POA: Diagnosis not present

## 2017-04-23 HISTORY — DX: Malignant neoplasm of prostate: C61

## 2017-04-23 NOTE — Progress Notes (Signed)
See progress note under physician encounter. 

## 2017-04-23 NOTE — Progress Notes (Signed)
Radiation Oncology         ([phone]) [phone] ________________________________  Initial Outpatient Consultation  Name: Terry Rogers MRN: [id number]  Date: 04/23/2017  DOB: [birth year]  WF:UXNATFT, [name], DO  Irine Seal, MD   REFERRING PHYSICIAN: Irine Seal, MD  DIAGNOSIS: 76 y.o. gentleman with Stage T1c adenocarcinoma of the prostate with Gleason Score of 3+4, and PSA of 4.28    ICD-10-CM   1. Malignant neoplasm of prostate ([place]) C61 MR PELVIS LTD WITHOUT CONTRAST FOR PROSTATE RTP    HISTORY OF PRESENT ILLNESS: Terry Rogers is a 76 y.o. male with a diagnosis of prostate cancer. He is a well established patient at [facility], followed for hypogonadism. He has been on testosterone replacement therapy for several years now.  He was noted to have an elevated PSA of 4.83 by his urologist, Dr. Rana Snare, during a routine follow-up for low testosterone on 01/01/2017. A digital rectal examination was performed at that time revealing firm left and right lobes but without any discrete nodules.  The patient was advised to stop testosterone replacement therapy at that time and repeat a PSA. He had a repeat PSA performed on 02/20/2017 which remained elevated at 4.28.  The patient proceeded to transrectal ultrasound with 12 biopsies of the prostate on 02/20/2017 with Dr. Irine Seal. The prostate volume measured 47 cc.  Out of 12 core biopsies, 2 were positive.  The maximum Gleason score was 3+4, and this was seen in the left base and left mid.  Biopsies of prostate revealed:     The patient reviewed the biopsy results with his urologist and he has kindly been referred today for discussion of potential radiation treatment options. He is most interested in brachytherapy. He mentions that he lives in [place] from December to February each year and refers to have treatment following his return in February if deemed appropriate. He has remained off of testosterone replacement therapy.   Of note,  the patient has a family history of prostate cancer in his maternal grandfather.  PREVIOUS RADIATION THERAPY: No  PAST MEDICAL HISTORY:  Past Medical History:  Diagnosis Date  . ED (erectile dysfunction)   . GERD (gastroesophageal reflux disease)   . Hyperlipidemia   . Hypertension   . Kidney stone   . Osteoarthritis    RA  . Prostate cancer ([place])   . Restless leg   . Ulcer       PAST SURGICAL HISTORY: Past Surgical History:  Procedure Laterality Date  . COLONOSCOPY  12/28/2003   6 hyperplastic polyps [name])  . ROTATOR CUFF REPAIR  2009   bilateral     FAMILY HISTORY:  Family History  Problem Relation Age of Onset  . Stroke Mother   . Alzheimer's disease Father   . Heart failure Maternal Grandmother   . Heart failure Maternal Grandfather   . Alzheimer's disease Paternal Grandmother     SOCIAL HISTORY:  Social History   Social History  . Marital status: Married    Spouse name: N/A  . Number of children: 3  . Years of education: N/A   Occupational History  . retired    Social History Main Topics  . Smoking status: Former Smoker    Packs/day: 1.50    Years: 50.00    Types: Cigarettes    Quit date: 10/22/2006  . Smokeless tobacco: Never Used     Comment: Pt uses Nicorette Gum currently  . Alcohol use 1.2 oz/week    2 Glasses of  wine per week     Comment: a day   . Drug use: No  . Sexual activity: Yes    Partners: Female   Other Topics Concern  . Not on file   Social History Narrative   Retired from [employer]   Married    Three daughters, all live in [place]   He likes to golf and play bridge.     ALLERGIES: Sulfonamide derivatives  MEDICATIONS:  Current Outpatient Prescriptions  Medication Sig Dispense Refill  . atenolol (TENORMIN) 50 MG tablet Take 1 tablet (50 mg total) by mouth daily. 90 tablet 0  . ezetimibe (ZETIA) 10 MG tablet Take 1 tablet (10 mg total) by mouth daily. 90 tablet 3  . fenofibrate (TRICOR) 145 MG tablet  Take 1 tablet (145 mg total) by mouth at bedtime. (Patient taking differently: Take 145 mg by mouth daily after breakfast. ) 90 tablet 1  . Multiple Vitamins-Minerals (CENTRUM SILVER ULTRA MENS PO) Take by mouth.    . naproxen sodium (ANAPROX) 220 MG tablet Take 220 mg by mouth 2 (two) times daily with a meal.    . Omega-3 Fatty Acids (FISH OIL) 1200 MG CAPS Take 1 capsule by mouth daily.    Marland Kitchen rOPINIRole (REQUIP XL) 2 MG 24 hr tablet Take 1 tablet (2 mg total) by mouth 2 (two) times daily. (Patient taking differently: Take 2 mg by mouth daily. 2 mg before bed and 1 mg prn.) 180 tablet 1  . B-D 3CC LUER-LOK SYR 21GX1" 21G X 1" 3 ML MISC See admin instructions.  3  . bismuth subsalicylate (PEPTO BISMOL) 262 MG/15ML suspension Take 30 mLs by mouth every 6 (six) hours as needed.    . hydrocortisone (ANUSOL-HC) 25 MG suppository PLACE 1 SUPPOSITORY (25 MG TOTAL) RECTALLY 2 (TWO) TIMES DAILY. (Patient not taking: Reported on 04/23/2017) 12 suppository 0  . testosterone cypionate (DEPOTESTOSTERONE CYPIONATE) 200 MG/ML injection Inject 200 mg into the muscle every 28 (twenty-eight) days.    Marland Kitchen triamcinolone cream (KENALOG) 0.1 % APPLY 1 APPLICATION        TOPICALLY TWO TIMES A DAY (Patient not taking: Reported on 04/23/2017) 454 g 3   No current facility-administered medications for this encounter.     REVIEW OF SYSTEMS:  On review of systems, the patient reports that he is doing well overall. He denies any chest pain, shortness of breath, cough, fevers, chills, night sweats, or unintended weight changes. He denies any bowel disturbances, and denies abdominal pain, nausea or vomiting. He denies any new musculoskeletal or joint aches or pains. His IPSS was 2, indicating mild urinary symptoms, including weak stream and nocturia. He denies dysuria, hematuria, leakage or incontinence. He reports mild ED symptoms and is able to complete sexual activity with taking Viagra. A complete review of systems is obtained and  is otherwise negative.    PHYSICAL EXAM:  Wt Readings from Last 3 Encounters:  04/23/17 230 lb 3.2 oz (104.4 kg)  03/20/17 231 lb 14.4 oz (105.2 kg)  12/26/16 226 lb 11.2 oz (102.8 kg)   Temp Readings from Last 3 Encounters:  12/12/16 98.9 F (37.2 C) (Oral)  05/13/16 98.4 F (36.9 C) (Oral)  02/08/16 98.6 F (37 C) (Oral)   BP Readings from Last 3 Encounters:  03/20/17 140/86  12/26/16 126/79  12/12/16 116/74   Pulse Readings from Last 3 Encounters:  03/20/17 62  12/26/16 98  12/12/16 93   Pain Assessment Pain Score: 0-No pain/10  In general this is  a well appearing Caucasian male in no acute distress. He is alert and oriented x4 and appropriate throughout the examination. HEENT reveals that the patient is normocephalic, atraumatic. EOMs are intact. PERRLA. Skin is intact without any evidence of gross lesions. Cardiovascular exam reveals a regular rate and rhythm, no clicks rubs or murmurs are auscultated. Chest is clear to auscultation bilaterally. Lymphatic assessment is performed and does not reveal any adenopathy in the cervical, supraclavicular, axillary, or inguinal chains. Abdomen has active bowel sounds in all quadrants and is intact. The abdomen is soft, non tender, non distended. Lower extremities are negative for pretibial pitting edema, deep calf tenderness, cyanosis or clubbing.  KPS = 100  100 - Normal; no complaints; no evidence of disease. 90   - Able to carry on normal activity; minor signs or symptoms of disease. 80   - Normal activity with effort; some signs or symptoms of disease. 40   - Cares for self; unable to carry on normal activity or to do active work. 60   - Requires occasional assistance, but is able to care for most of his personal needs. 50   - Requires considerable assistance and frequent medical care. 68   - Disabled; requires special care and assistance. 76   - Severely disabled; hospital admission is indicated although death not  imminent. 44   - Very sick; hospital admission necessary; active supportive treatment necessary. 10   - Moribund; fatal processes progressing rapidly. 0     - Dead  Karnofsky DA, Abelmann Osakis, Craver LS and Burchenal [facility] [phone]) The use of the nitrogen mustards in the palliative treatment of carcinoma: with particular reference to bronchogenic carcinoma Cancer 1 634-56  LABORATORY DATA:  Lab Results  Component Value Date   WBC 4.7 09/05/2016   HGB 15.9 09/05/2016   HCT 49.7 09/05/2016   MCV 87 09/05/2016   PLT 142 (L) 09/05/2016   Lab Results  Component Value Date   NA 143 09/05/2016   K 5.8 (H) 09/05/2016   CL 103 09/05/2016   CO2 24 09/05/2016   Lab Results  Component Value Date   ALT 13 09/05/2016   AST 21 09/05/2016   ALKPHOS 78 09/05/2016   BILITOT 0.5 09/05/2016     RADIOGRAPHY: No results found.    IMPRESSION/PLAN: 1. 76 y.o. gentleman with Stage T1c adenocarcinoma of the prostate with Gleason Score of 3+4, and PSA of 4.28. We discussed the patient's workup and outlined the nature of prostate cancer in this setting. The patient's T stage, Gleason's score, and PSA put him into the intermediate risk group. Accordingly, he is eligible for a variety of potential treatment options including brachytherapy or 8 weeks of external radiation. We discussed the available radiation techniques, and focused on the details and logistics and delivery. We discussed and outlined the risks, benefits, short and long-term effects associated with radiotherapy and compared and contrasted these with prostatectomy.   At the conclusion of our conversation, the patient is interested in moving forward with brachytherapy and use of SpaceOAR to reduce rectal toxicity from radiotherapy.  We will share our discussion with Dr. Jeffie Pollock and move forward with scheduling the procedure for mid to late February 2019, when he returns from [place]. The patient met briefly with Romie Jumper in our office who will be  working closely with him to coordinate OR scheduling and pre and post procedure appointments.  We will contact the pharmaceutical rep to ensure that [place] is available at the time of procedure.  He will have a prostate MRI following his post-seed CT SIM to confirm appropriate distribution of the [place].The patient expressed interest in prostate brachytherapy.  We enjoyed meeting with him today, and will look forward to participating in the care of this very nice gentleman.  We spent 50 minutes face to face with the patient and more than 50% of that time was spent in counseling and/or coordination of care.    Nicholos Johns, PA-C    Tyler Pita, MD  [facility] Direct Dial: [phone]  Fax: [phone] [email or web address]  Skype  [email or web address]    Page Me   This document serves as a record of services personally performed by Tyler Pita, MD and Freeman Caldron, PA-C. It was created on their behalf by Rae Lips, a trained medical scribe. The creation of this record is based on the scribe's personal observations and the providers' statements to them. This document has been checked and approved by the attending providers.

## 2017-04-25 ENCOUNTER — Telehealth: Payer: Self-pay | Admitting: *Deleted

## 2017-04-25 NOTE — Telephone Encounter (Signed)
CALLED PATIENT TO INFORM OF PRE-SEED APPTS. ON 06-06-17, LVM FOR A RETURN CALL

## 2017-05-02 ENCOUNTER — Telehealth: Payer: Self-pay | Admitting: *Deleted

## 2017-05-02 NOTE — Telephone Encounter (Signed)
CALLED PATIENT TO GIVE AN UPDATE, LVM FOR A RETURN CALL 

## 2017-05-08 ENCOUNTER — Ambulatory Visit: Payer: Medicare HMO | Admitting: Family Medicine

## 2017-05-08 DIAGNOSIS — K760 Fatty (change of) liver, not elsewhere classified: Secondary | ICD-10-CM

## 2017-05-08 DIAGNOSIS — R918 Other nonspecific abnormal finding of lung field: Secondary | ICD-10-CM

## 2017-05-08 DIAGNOSIS — I2510 Atherosclerotic heart disease of native coronary artery without angina pectoris: Secondary | ICD-10-CM

## 2017-05-08 HISTORY — DX: Atherosclerotic heart disease of native coronary artery without angina pectoris: I25.10

## 2017-05-08 HISTORY — DX: Other nonspecific abnormal finding of lung field: R91.8

## 2017-05-08 HISTORY — DX: Fatty (change of) liver, not elsewhere classified: K76.0

## 2017-05-15 ENCOUNTER — Ambulatory Visit (INDEPENDENT_AMBULATORY_CARE_PROVIDER_SITE_OTHER): Payer: Medicare HMO | Admitting: Family Medicine

## 2017-05-15 ENCOUNTER — Encounter: Payer: Self-pay | Admitting: Family Medicine

## 2017-05-15 VITALS — BP 128/76 | HR 60 | Ht 70.5 in | Wt 233.5 lb

## 2017-05-15 DIAGNOSIS — D751 Secondary polycythemia: Secondary | ICD-10-CM

## 2017-05-15 DIAGNOSIS — Z1389 Encounter for screening for other disorder: Secondary | ICD-10-CM

## 2017-05-15 DIAGNOSIS — E782 Mixed hyperlipidemia: Secondary | ICD-10-CM

## 2017-05-15 DIAGNOSIS — C61 Malignant neoplasm of prostate: Secondary | ICD-10-CM

## 2017-05-15 DIAGNOSIS — Z0000 Encounter for general adult medical examination without abnormal findings: Secondary | ICD-10-CM

## 2017-05-15 DIAGNOSIS — D508 Other iron deficiency anemias: Secondary | ICD-10-CM | POA: Diagnosis not present

## 2017-05-15 DIAGNOSIS — Z87891 Personal history of nicotine dependence: Secondary | ICD-10-CM | POA: Diagnosis not present

## 2017-05-15 DIAGNOSIS — G2581 Restless legs syndrome: Secondary | ICD-10-CM | POA: Diagnosis not present

## 2017-05-15 DIAGNOSIS — R234 Changes in skin texture: Secondary | ICD-10-CM | POA: Diagnosis not present

## 2017-05-15 DIAGNOSIS — I10 Essential (primary) hypertension: Secondary | ICD-10-CM

## 2017-05-15 DIAGNOSIS — R739 Hyperglycemia, unspecified: Secondary | ICD-10-CM

## 2017-05-15 DIAGNOSIS — Z23 Encounter for immunization: Secondary | ICD-10-CM

## 2017-05-15 MED ORDER — ZOSTER VAC RECOMB ADJUVANTED 50 MCG/0.5ML IM SUSR: 0.5000 mL | Freq: Once | INTRAMUSCULAR | 0 refills | Status: AC

## 2017-05-15 MED ORDER — TRIAMCINOLONE 0.1 % CREAM:EUCERIN CREAM 1:1: TOPICAL_CREAM | Freq: Three times a day (TID) | CUTANEOUS | Status: DC | PRN

## 2017-05-15 NOTE — Patient Instructions (Addendum)
Melissa please update his [facility] prescription directions.  They are not accurate in the chart.     Preventive Care for Adults, Male A healthy lifestyle and preventive care can promote health and wellness. Preventive health guidelines for men include the following key practices:  A routine yearly physical is a good way to check with your health care provider about your health and preventative screening. It is a chance to share any concerns and updates on your health and to receive a thorough exam.  Visit your dentist for a routine exam and preventative care every 6 months. Brush your teeth twice a day and floss once a day. Good oral hygiene prevents tooth decay and gum disease.  The frequency of eye exams is based on your age, health, family medical history, use of contact lenses, and other factors. Follow your health care provider's recommendations for frequency of eye exams.  Eat a healthy diet. Foods such as vegetables, fruits, whole grains, low-fat dairy products, and lean protein foods contain the nutrients you need without too many calories. Decrease your intake of foods high in solid fats, added sugars, and salt. Eat the right amount of calories for you.Get information about a proper diet from your health care provider, if necessary.  Regular physical exercise is one of the most important things you can do for your health. Most adults should get at least 150 minutes of moderate-intensity exercise (any activity that increases your heart rate and causes you to sweat) each week. In addition, most adults need muscle-strengthening exercises on 2 or more days a week.  Maintain a healthy weight. The body mass index (BMI) is a screening tool to identify possible weight problems. It provides an estimate of body fat based on height and weight. Your health care provider can find your BMI and can help you achieve or maintain a healthy weight.For adults 20 years and older:  A BMI below 18.5 is  considered underweight.  A BMI of 18.5 to 24.9 is normal.  A BMI of 25 to 29.9 is considered overweight.  A BMI of 30 and above is considered obese.  Maintain normal blood lipids and cholesterol levels by exercising and minimizing your intake of saturated fat. Eat a balanced diet with plenty of fruit and vegetables. Blood tests for lipids and cholesterol should begin at age 57 and be repeated every 5 years. If your lipid or cholesterol levels are high, you are over 50, or you are at high risk for heart disease, you may need your cholesterol levels checked more frequently.Ongoing high lipid and cholesterol levels should be treated with medicines if diet and exercise are not working.  If you smoke, find out from your health care provider how to quit. If you do not use tobacco, do not start.  Lung cancer screening is recommended for adults aged 31-80 years who are at high risk for developing lung cancer because of a history of smoking. A yearly low-dose CT scan of the lungs is recommended for people who have at least a 30-pack-year history of smoking and are a current smoker or have quit within the past 15 years. A pack year of smoking is smoking an average of 1 pack of cigarettes a day for 1 year (for example: 1 pack a day for 30 years or 2 packs a day for 15 years). Yearly screening should continue until the smoker has stopped smoking for at least 15 years. Yearly screening should be stopped for people who develop a health problem that  would prevent them from having lung cancer treatment.  If you choose to drink alcohol, do not have more than 2 drinks per day. One drink is considered to be 12 ounces (355 mL) of beer, 5 ounces (148 mL) of wine, or 1.5 ounces (44 mL) of liquor.  Avoid use of street drugs. Do not share needles with anyone. Ask for help if you need support or instructions about stopping the use of drugs.  High blood pressure causes heart disease and increases the risk of stroke. Your  blood pressure should be checked at least every 1-2 years. Ongoing high blood pressure should be treated with medicines, if weight loss and exercise are not effective.  If you are 70-28 years old, ask your health care provider if you should take aspirin to prevent heart disease.  Diabetes screening is done by taking a blood sample to check your blood glucose level after you have not eaten for a certain period of time (fasting). If you are not overweight and you do not have risk factors for diabetes, you should be screened once every 3 years starting at age 44. If you are overweight or obese and you are 1-36 years of age, you should be screened for diabetes every year as part of your cardiovascular risk assessment.  Colorectal cancer can be detected and often prevented. Most routine colorectal cancer screening begins at the age of 17 and continues through age 34. However, your health care provider may recommend screening at an earlier age if you have risk factors for colon cancer. On a yearly basis, your health care provider may provide home test kits to check for hidden blood in the stool. Use of a small camera at the end of a tube to directly examine the colon (sigmoidoscopy or colonoscopy) can detect the earliest forms of colorectal cancer. Talk to your health care provider about this at age 54, when routine screening begins. Direct exam of the colon should be repeated every 5-10 years through age 47, unless early forms of precancerous polyps or small growths are found.  People who are at an increased risk for hepatitis B should be screened for this virus. You are considered at high risk for hepatitis B if:  You were born in a country where hepatitis B occurs often. Talk with your health care provider about which countries are considered high risk.  Your parents were born in a high-risk country and you have not received a shot to protect against hepatitis B (hepatitis B vaccine).  You have HIV or  AIDS.  You use needles to inject street drugs.  You live with, or have sex with, someone who has hepatitis B.  You are a man who has sex with other men (MSM).  You get hemodialysis treatment.  You take certain medicines for conditions such as cancer, organ transplantation, and autoimmune conditions.  Hepatitis C blood testing is recommended for all people born from [birth year] through 1965 and any individual with known risks for hepatitis C.  Practice safe sex. Use condoms and avoid high-risk sexual practices to reduce the spread of sexually transmitted infections (STIs). STIs include gonorrhea, chlamydia, syphilis, trichomonas, herpes, HPV, and human immunodeficiency virus (HIV). Herpes, HIV, and HPV are viral illnesses that have no cure. They can result in disability, cancer, and death.  If you are a man who has sex with other men, you should be screened at least once per year for:  HIV.  Urethral, rectal, and pharyngeal infection of gonorrhea, chlamydia, or  both.  If you are at risk of being infected with HIV, it is recommended that you take a prescription medicine daily to prevent HIV infection. This is called preexposure prophylaxis (PrEP). You are considered at risk if:  You are a man who has sex with other men (MSM) and have other risk factors.  You are a heterosexual man, are sexually active, and are at increased risk for HIV infection.  You take drugs by injection.  You are sexually active with a partner who has HIV.  Talk with your health care provider about whether you are at high risk of being infected with HIV. If you choose to begin PrEP, you should first be tested for HIV. You should then be tested every 3 months for as long as you are taking PrEP.  A one-time screening for abdominal aortic aneurysm (AAA) and surgical repair of large AAAs by ultrasound are recommended for men ages 46 to 65 years who are current or former smokers.  Healthy men should no longer receive  prostate-specific antigen (PSA) blood tests as part of routine cancer screening. Talk with your health care provider about prostate cancer screening.  Testicular cancer screening is not recommended for adult males who have no symptoms. Screening includes self-exam, a health care provider exam, and other screening tests. Consult with your health care provider about any symptoms you have or any concerns you have about testicular cancer.  Use sunscreen. Apply sunscreen liberally and repeatedly throughout the day. You should seek shade when your shadow is shorter than you. Protect yourself by wearing long sleeves, pants, a wide-brimmed hat, and sunglasses year round, whenever you are outdoors.  Once a month, do a whole-body skin exam, using a mirror to look at the skin on your back. Tell your health care provider about new moles, moles that have irregular borders, moles that are larger than a pencil eraser, or moles that have changed in shape or color.  Stay current with required vaccines (immunizations).  Influenza vaccine. All adults should be immunized every year.  Tetanus, diphtheria, and acellular pertussis (Td, Tdap) vaccine. An adult who has not previously received Tdap or who does not know his vaccine status should receive 1 dose of Tdap. This initial dose should be followed by tetanus and diphtheria toxoids (Td) booster doses every 10 years. Adults with an unknown or incomplete history of completing a 3-dose immunization series with Td-containing vaccines should begin or complete a primary immunization series including a Tdap dose. Adults should receive a Td booster every 10 years.  Varicella vaccine. An adult without evidence of immunity to varicella should receive 2 doses or a second dose if he has previously received 1 dose.  Human papillomavirus (HPV) vaccine. Males aged 11-21 years who have not received the vaccine previously should receive the 3-dose series. Males aged 22-26 years may be  immunized. Immunization is recommended through the age of 90 years for any male who has sex with males and did not get any or all doses earlier. Immunization is recommended for any person with an immunocompromised condition through the age of 54 years if he did not get any or all doses earlier. During the 3-dose series, the second dose should be obtained 4-8 weeks after the first dose. The third dose should be obtained 24 weeks after the first dose and 16 weeks after the second dose.  Zoster vaccine. One dose is recommended for adults aged 75 years or older unless certain conditions are present.  Measles, mumps, and  rubella (MMR) vaccine. Adults born before [birth year] generally are considered immune to measles and mumps. Adults born in [birth year] or later should have 1 or more doses of MMR vaccine unless there is a contraindication to the vaccine or there is laboratory evidence of immunity to each of the three diseases. A routine second dose of MMR vaccine should be obtained at least 28 days after the first dose for students attending postsecondary schools, health care workers, or international travelers. People who received inactivated measles vaccine or an unknown type of measles vaccine during 1963-1967 should receive 2 doses of MMR vaccine. People who received inactivated mumps vaccine or an unknown type of mumps vaccine before 1979 and are at high risk for mumps infection should consider immunization with 2 doses of MMR vaccine. Unvaccinated health care workers born before [birth year] who lack laboratory evidence of measles, mumps, or rubella immunity or laboratory confirmation of disease should consider measles and mumps immunization with 2 doses of MMR vaccine or rubella immunization with 1 dose of MMR vaccine.  Pneumococcal 13-valent conjugate (PCV13) vaccine. When indicated, a person who is uncertain of his immunization history and has no record of immunization should receive the PCV13 vaccine. All adults 67 years of  age and older should receive this vaccine. An adult aged 53 years or older who has certain medical conditions and has not been previously immunized should receive 1 dose of PCV13 vaccine. This PCV13 should be followed with a dose of pneumococcal polysaccharide (PPSV23) vaccine. Adults who are at high risk for pneumococcal disease should obtain the PPSV23 vaccine at least 8 weeks after the dose of PCV13 vaccine. Adults older than 75 years of age who have normal immune system function should obtain the PPSV23 vaccine dose at least 1 year after the dose of PCV13 vaccine.  Pneumococcal polysaccharide (PPSV23) vaccine. When PCV13 is also indicated, PCV13 should be obtained first. All adults aged 39 years and older should be immunized. An adult younger than age 65 years who has certain medical conditions should be immunized. Any person who resides in a nursing home or long-term care facility should be immunized. An adult smoker should be immunized. People with an immunocompromised condition and certain other conditions should receive both PCV13 and PPSV23 vaccines. People with human immunodeficiency virus (HIV) infection should be immunized as soon as possible after diagnosis. Immunization during chemotherapy or radiation therapy should be avoided. Routine use of PPSV23 vaccine is not recommended for American Indians, [place]atives, or people younger than 65 years unless there are medical conditions that require PPSV23 vaccine. When indicated, people who have unknown immunization and have no record of immunization should receive PPSV23 vaccine. One-time revaccination 5 years after the first dose of PPSV23 is recommended for people aged 19-64 years who have chronic kidney failure, nephrotic syndrome, asplenia, or immunocompromised conditions. People who received 1-2 doses of PPSV23 before age 72 years should receive another dose of PPSV23 vaccine at age 15 years or later if at least 5 years have passed since the  previous dose. Doses of PPSV23 are not needed for people immunized with PPSV23 at or after age 59 years.  Meningococcal vaccine. Adults with asplenia or persistent complement component deficiencies should receive 2 doses of quadrivalent meningococcal conjugate (MenACWY-D) vaccine. The doses should be obtained at least 2 months apart. Microbiologists working with certain meningococcal bacteria, [place] recruits, people at risk during an outbreak, and people who travel to or live in countries with a high rate of meningitis should be immunized.  A first-year college student up through age 42 years who is living in a residence hall should receive a dose if he did not receive a dose on or after his 16th birthday. Adults who have certain high-risk conditions should receive one or more doses of vaccine.  Hepatitis A vaccine. Adults who wish to be protected from this disease, have chronic liver disease, work with hepatitis A-infected animals, work in hepatitis A research labs, or travel to or work in countries with a high rate of hepatitis A should be immunized. Adults who were previously unvaccinated and who anticipate close contact with an international adoptee during the first 60 days after arrival in the [place] from a country with a high rate of hepatitis A should be immunized.  Hepatitis B vaccine. Adults should be immunized if they wish to be protected from this disease, are under age 62 years and have diabetes, have chronic liver disease, have had more than one sex partner in the past 6 months, may be exposed to blood or other infectious body fluids, are household contacts or sex partners of hepatitis B positive people, are clients or workers in certain care facilities, or travel to or work in countries with a high rate of hepatitis B.  Haemophilus influenzae type b (Hib) vaccine. A previously unvaccinated person with asplenia or sickle cell disease or having a scheduled splenectomy should receive 1  dose of Hib vaccine. Regardless of previous immunization, a recipient of a hematopoietic stem cell transplant should receive a 3-dose series 6-12 months after his successful transplant. Hib vaccine is not recommended for adults with HIV infection. Preventive Service / Frequency Ages 23 to 39  Blood pressure check.** / Every 3-5 years.  Lipid and cholesterol check.** / Every 5 years beginning at age 29.  Hepatitis C blood test.** / For any individual with known risks for hepatitis C.  Skin self-exam. / Monthly.  Influenza vaccine. / Every year.  Tetanus, diphtheria, and acellular pertussis (Tdap, Td) vaccine.** / Consult your health care provider. 1 dose of Td every 10 years.  Varicella vaccine.** / Consult your health care provider.  HPV vaccine. / 3 doses over 6 months, if 58 or younger.  Measles, mumps, rubella (MMR) vaccine.** / You need at least 1 dose of MMR if you were born in 1957 or later. You may also need a second dose.  Pneumococcal 13-valent conjugate (PCV13) vaccine.** / Consult your health care provider.  Pneumococcal polysaccharide (PPSV23) vaccine.** / 1 to 2 doses if you smoke cigarettes or if you have certain conditions.  Meningococcal vaccine.** / 1 dose if you are age 61 to 72 years and a Market researcher living in a residence hall, or have one of several medical conditions. You may also need additional booster doses.  Hepatitis A vaccine.** / Consult your health care provider.  Hepatitis B vaccine.** / Consult your health care provider.  Haemophilus influenzae type b (Hib) vaccine.** / Consult your health care provider. Ages 66 to 45  Blood pressure check.** / Every year.  Lipid and cholesterol check.** / Every 5 years beginning at age 46.  Lung cancer screening. / Every year if you are aged 33-80 years and have a 30-pack-year history of smoking and currently smoke or have quit within the past 15 years. Yearly screening is stopped once you have  quit smoking for at least 15 years or develop a health problem that would prevent you from having lung cancer treatment.  Fecal occult blood test (FOBT) of  stool. / Every year beginning at age 17 and continuing until age 87. You may not have to do this test if you get a colonoscopy every 10 years.  Flexible sigmoidoscopy** or colonoscopy.** / Every 5 years for a flexible sigmoidoscopy or every 10 years for a colonoscopy beginning at age 29 and continuing until age 26.  Hepatitis C blood test.** / For all people born from [birth year] through 1965 and any individual with known risks for hepatitis C.  Skin self-exam. / Monthly.  Influenza vaccine. / Every year.  Tetanus, diphtheria, and acellular pertussis (Tdap/Td) vaccine.** / Consult your health care provider. 1 dose of Td every 10 years.  Varicella vaccine.** / Consult your health care provider.  Zoster vaccine.** / 1 dose for adults aged 6 years or older.  Measles, mumps, rubella (MMR) vaccine.** / You need at least 1 dose of MMR if you were born in 1957 or later. You may also need a second dose.  Pneumococcal 13-valent conjugate (PCV13) vaccine.** / Consult your health care provider.  Pneumococcal polysaccharide (PPSV23) vaccine.** / 1 to 2 doses if you smoke cigarettes or if you have certain conditions.  Meningococcal vaccine.** / Consult your health care provider.  Hepatitis A vaccine.** / Consult your health care provider.  Hepatitis B vaccine.** / Consult your health care provider.  Haemophilus influenzae type b (Hib) vaccine.** / Consult your health care provider. Ages 1 and over  Blood pressure check.** / Every year.  Lipid and cholesterol check.**/ Every 5 years beginning at age 88.  Lung cancer screening. / Every year if you are aged 55-80 years and have a 30-pack-year history of smoking and currently smoke or have quit within the past 15 years. Yearly screening is stopped once you have quit smoking for at least 15 years or  develop a health problem that would prevent you from having lung cancer treatment.  Fecal occult blood test (FOBT) of stool. / Every year beginning at age 61 and continuing until age 61. You may not have to do this test if you get a colonoscopy every 10 years.  Flexible sigmoidoscopy** or colonoscopy.** / Every 5 years for a flexible sigmoidoscopy or every 10 years for a colonoscopy beginning at age 23 and continuing until age 10.  Hepatitis C blood test.** / For all people born from [birth year] through 1964 and any individual with known risks for hepatitis C.  Abdominal aortic aneurysm (AAA) screening.** / A one-time screening for ages 28 to 4 years who are current or former smokers.  Skin self-exam. / Monthly.  Influenza vaccine. / Every year.  Tetanus, diphtheria, and acellular pertussis (Tdap/Td) vaccine.** / 1 dose of Td every 10 years.  Varicella vaccine.** / Consult your health care provider.  Zoster vaccine.** / 1 dose for adults aged 63 years or older.  Pneumococcal 13-valent conjugate (PCV13) vaccine.** / 1 dose for all adults aged 66 years and older.  Pneumococcal polysaccharide (PPSV23) vaccine.** / 1 dose for all adults aged 73 years and older.  Meningococcal vaccine.** / Consult your health care provider.  Hepatitis A vaccine.** / Consult your health care provider.  Hepatitis B vaccine.** / Consult your health care provider.  Haemophilus influenzae type b (Hib) vaccine.** / Consult your health care provider. **Family history and personal history of risk and conditions may change your health care provider's recommendations.   This information is not intended to replace advice given to you by your health care provider. Make sure you discuss any questions you have with your  health care provider.   Document Released: 08/20/2001 Document Revised: 07/15/2014 Document Reviewed: 11/19/2010 Elsevier Interactive Patient Education [employer].

## 2017-05-15 NOTE — Progress Notes (Signed)
Male physical  Impression and Recommendations:    1. Encounter for wellness examination   2. Screening for multiple conditions   3. History of smoking 30 or more pack years; quit less than 11 yrs ago.    4. Other iron deficiency anemia   5. Restless leg syndrome   6. h/o Elevated blood sugar/ Pre-Diabetes   7. Essential hypertension   8. Hyperlipidemia, mixed   9. History of tobacco use disorder- >er 60 pk yr hx   10. Polycythemia, secondary to T injections    11. Malignant neoplasm of prostate ([place])   12. Need for zoster vaccination      Orders Placed This Encounter  Procedures  . CT CHEST LUNG CANCER SCREENING LOW DOSE WO CONTRAST    233 / no needs / [employer] order/ miriam  w tyler [phone]    Standing Status:   Future    Standing Expiration Date:   07/15/2018    Order Specific Question:   Reason for Exam (SYMPTOM  OR DIAGNOSIS REQUIRED)    Answer:   Patient has never had screening for lung cancer.  This is his initial test.  Quit 11 years ago with a greater than 30-pack-year history.    Order Specific Question:   Preferred Imaging Location?    Answer:   [facility]    Order Specific Question:   Preferred imaging location?    Answer:   [facility]    Order Specific Question:   Radiology Contrast Protocol - do NOT remove file path    Answer:   \\[email or web address].pdf  . US Aorta Initial Medicare Screen    Epic ORDER WT 233/NO NEEDS INS AETNA MCR SB W/TYLER @ OFFICE    Standing Status:   Future    Standing Expiration Date:   07/15/2018    Order Specific Question:   Reason for Exam (SYMPTOM  OR DIAGNOSIS REQUIRED)    Answer:   Screening AAA exam, extensive medical history smoking    Order Specific Question:   Preferred imaging location?    Answer:   [facility]  . CBC with Differential/Platelet  . Comprehensive metabolic panel    Order Specific Question:   Has the patient fasted?    Answer:   Yes  . Hemoglobin  A1c  . Lipid panel    Order Specific Question:   Has the patient fasted?    Answer:   Yes  . TSH  . T4, free  . VITAMIN D 25 Hydroxy (Vit-D Deficiency, Fractures)  . Microalbumin / creatinine urine ratio  . Vitamin B12  . Folate  . Iron and TIBC  . Ferritin   Orders Placed This Encounter  Procedures  . CT CHEST LUNG CANCER SCREENING LOW DOSE WO CONTRAST  . US Aorta Initial Medicare Screen  . CBC with Differential/Platelet  . Comprehensive metabolic panel  . Hemoglobin A1c  . Lipid panel  . TSH  . T4, free  . VITAMIN D 25 Hydroxy (Vit-D Deficiency, Fractures)  . Microalbumin / creatinine urine ratio  . Vitamin B12  . Folate  . Iron and TIBC  . Ferritin    1) Anticipatory Guidance: Discussed importance of wearing a seatbelt while driving, not texting while driving;   sunscreen when outside along with skin surveillance; eating a balanced and modest diet; physical activity at least 25 minutes per day or 150 min/ week moderate to intense activity.  2) Immunizations / Screenings / Labs:  All immunizations are up-to-date per recommendations or will be updated today. Patient is due for dental and vision screens which pt will schedule independently. Will obtain CBC, CMP, HgA1c, Lipid panel, TSH and vit D when fasting, if not already done recently.   3) Weight:  BMI meaning discussed with patient.  Discussed goal of losing 5-10% of current body weight which would improve overall feelings of well being and improve objective health data. Improve nutrient density of diet through increasing intake of fruits and vegetables and decreasing saturated fats, white flour products and refined sugars.      Medication List        Accurate as of 05/15/17  1:15 PM. Always use your most recent med list.          atenolol 50 MG tablet Commonly known as:  TENORMIN Take 1 tablet (50 mg total) by mouth daily.   B-D 3CC LUER-LOK SYR 21GX1" 21G X 1" 3 ML Misc Generic drug:  SYRINGE-NEEDLE  (DISP) 3 ML   bismuth subsalicylate 242 PN/36RW suspension Commonly known as:  PEPTO BISMOL   CENTRUM SILVER ULTRA MENS PO   ezetimibe 10 MG tablet Commonly known as:  ZETIA Take 1 tablet (10 mg total) by mouth daily.   fenofibrate 145 MG tablet Commonly known as:  TRICOR Take 1 tablet (145 mg total) by mouth at bedtime.   Fish Oil 1200 MG Caps   hydrocortisone 25 MG suppository Commonly known as:  ANUSOL-HC PLACE 1 SUPPOSITORY (25 MG TOTAL) RECTALLY 2 (TWO) TIMES DAILY.   naproxen sodium 220 MG tablet Commonly known as:  ALEVE   * rOPINIRole 2 MG 24 hr tablet Commonly known as:  REQUIP XL   * rOPINIRole 1 MG tablet Commonly known as:  REQUIP   testosterone cypionate 200 MG/ML injection Commonly known as:  DEPOTESTOSTERONE CYPIONATE   triamcinolone cream 0.1 % Commonly known as:  KENALOG APPLY 1 APPLICATION        TOPICALLY TWO TIMES A DAY   Zoster Vaccine Adjuvanted injection Commonly known as:  SHINGRIX Inject 0.5 mLs once for 1 dose into the muscle.      * This list has 2 medication(s) that are the same as other medications prescribed for you. Read the directions carefully, and ask your doctor or other care provider to review them with you.          Where to Get Your Medications    [id number] [place], [address], [place] [address]   Phone:  [phone]   Zoster Vaccine Adjuvanted injection      Please see AVS handed out to patient at the end of our visit for further patient instructions/ counseling done pertaining to today's office visit.   Gross side effects, risk and benefits, and alternatives of medications discussed with patient.  Patient is aware that all medications have potential side effects and we are unable to predict every side effect or drug-drug interaction that may occur.  Expresses verbal understanding and consents to current therapy plan and  treatment regimen.  Follow-up preventative CPE in 1 year. Follow-up office visit pending lab work.  F/up sooner for chronic care management and/or prn    Subjective:    CC: CPE  HPI: Terry Rogers is a 76 y.o. male who presents to [place] [facility] today for a yearly health maintenance exam.    Health Maintenance Summary Reviewed  and updated, unless pt declines services.  Aspirin: administering 81 mg daily Colonoscopy:     (Unnecessary secondary to < 32 or > 8 years old.)   Immunizations will be updated today by CMA, will be given if patient agrees-  Tobacco History Reviewed: Yes  CT scan for screening lung CA:   Will be ordered never done. Abdominal Ultrasound:     ( Unnecessary secondary to < 56 or > 58 years old) Alcohol:    No concerns, no excessive use Exercise Habits: Not meeting AHA guidelines. STD concerns:   none Drug Use:   None Birth control method:   n/a Testicular/penile concerns:     Has some erectile difficulties-especially since going off his testosterone injections.  Sees urology regularly for his prostate cancer.  Gets digital rectal exams and urological exams from them yearly.   Health Maintenance  Topic Date Due  . INFLUENZA VACCINE  10/08/2017 (Originally 02/05/2017)  . COLONOSCOPY  08/01/2024  . TETANUS/TDAP  06/13/2025  . PNA vac Low Risk Adult  Completed      Wt Readings from Last 3 Encounters:  05/15/17 233 lb 8 oz (105.9 kg)  04/23/17 230 lb 3.2 oz (104.4 kg)  03/20/17 231 lb 14.4 oz (105.2 kg)   BP Readings from Last 3 Encounters:  05/15/17 128/76  03/20/17 140/86  12/26/16 126/79   Pulse Readings from Last 3 Encounters:  05/15/17 60  03/20/17 62  12/26/16 98    Patient Active Problem List   Diagnosis Date Noted  . Hypertriglyceridemia 06/13/2016    Priority: High  . Hyperlipidemia, mixed 03/30/2016    Priority: High  . Hyperlipidemia type III 08/25/2013    Priority: High  . Restless leg syndrome 04/20/2013      Priority: High  . h/o Elevated blood sugar/ Pre-Diabetes 04/20/2013    Priority: High  . Essential hypertension 06/09/2008    Priority: High  . Benign prostatic hyperplasia with nocturia 06/13/2016    Priority: Medium  . Obese- esp abdomen 02/29/2016    Priority: Medium  . GERD 04/10/2007    Priority: Medium  . Polycythemia, secondary to T injections  03/30/2016    Priority: Low  . Chronic leukopenia 03/29/2016    Priority: Low  . Hypogonadism in male / Low T 02/29/2016    Priority: Low  . h/o Iron deficiency anemia 02/29/2016    Priority: Low  . History of tobacco use disorder- >er 60 pk yr hx 02/29/2016    Priority: Low  . Sleep difficulties 09/16/2011    Priority: Low  . Osteoarthritis 06/09/2008    Priority: Low  . ERECTILE DYSFUNCTION, MILD 05/14/2007    Priority: Low  . Malignant neoplasm of prostate ([place]) 04/23/2017  . Heat rash 12/26/2016  . Chronic increased urinary frequency 06/13/2016  . Snoring 10/22/2011  . SPONDYLITIS, ANKYLOSING 05/14/2007  . ALLERGIC RHINITIS 04/10/2007    Past Medical History:  Diagnosis Date  . ED (erectile dysfunction)   . GERD (gastroesophageal reflux disease)   . Hyperlipidemia   . Hypertension   . Kidney stone   . Osteoarthritis    RA  . Prostate cancer ([place])   . Restless leg   . Ulcer     Past Surgical History:  Procedure Laterality Date  . COLONOSCOPY  12/28/2003   6 hyperplastic polyps [name])  . ROTATOR CUFF REPAIR  2009   bilateral     Family History  Problem Relation Age of Onset  . Stroke Mother   . Alzheimer's disease  Father   . Heart failure Maternal Grandmother   . Heart failure Maternal Grandfather   . Alzheimer's disease Paternal Grandmother     Social History   Substance and Sexual Activity  Drug Use No  ,  Social History   Substance and Sexual Activity  Alcohol Use Yes  . Alcohol/week: 1.2 oz  . Types: 2 Glasses of wine per week   Comment: a day   ,  Social History   Tobacco Use   Smoking Status Former Smoker  . Packs/day: 1.50  . Years: 50.00  . Pack years: 75.00  . Types: Cigarettes  . Last attempt to quit: 10/22/2006  . Years since quitting: 10.5  Smokeless Tobacco Never Used  Tobacco Comment   Pt uses Nicorette Gum currently  ,  Social History   Substance and Sexual Activity  Sexual Activity Yes  . Partners: Female      Medication List        Accurate as of 05/15/17  1:15 PM. Always use your most recent med list.          atenolol 50 MG tablet Commonly known as:  TENORMIN Take 1 tablet (50 mg total) by mouth daily.   B-D 3CC LUER-LOK SYR 21GX1" 21G X 1" 3 ML Misc Generic drug:  SYRINGE-NEEDLE (DISP) 3 ML   bismuth subsalicylate 161 WR/60AV suspension Commonly known as:  PEPTO BISMOL   CENTRUM SILVER ULTRA MENS PO   ezetimibe 10 MG tablet Commonly known as:  ZETIA Take 1 tablet (10 mg total) by mouth daily.   fenofibrate 145 MG tablet Commonly known as:  TRICOR Take 1 tablet (145 mg total) by mouth at bedtime.   Fish Oil 1200 MG Caps   hydrocortisone 25 MG suppository Commonly known as:  ANUSOL-HC PLACE 1 SUPPOSITORY (25 MG TOTAL) RECTALLY 2 (TWO) TIMES DAILY.   naproxen sodium 220 MG tablet Commonly known as:  ALEVE   * rOPINIRole 2 MG 24 hr tablet Commonly known as:  REQUIP XL   * rOPINIRole 1 MG tablet Commonly known as:  REQUIP   testosterone cypionate 200 MG/ML injection Commonly known as:  DEPOTESTOSTERONE CYPIONATE   triamcinolone cream 0.1 % Commonly known as:  KENALOG APPLY 1 APPLICATION        TOPICALLY TWO TIMES A DAY   Zoster Vaccine Adjuvanted injection Commonly known as:  SHINGRIX Inject 0.5 mLs once for 1 dose into the muscle.      * This list has 2 medication(s) that are the same as other medications prescribed for you. Read the directions carefully, and ask your doctor or other care provider to review them with you.          Where to Get Your Medications    [id number] [place], [address], [place] [address]   Phone:  [phone]   Zoster Vaccine Adjuvanted injection     Sulfonamide derivatives  Review of Systems: General:   Denies fever, chills, unexplained weight loss.  Optho/Auditory:   Denies visual changes, blurred vision/LOV Respiratory:   Denies SOB, DOE more than baseline levels.  Cardiovascular:   Denies chest pain, palpitations, new onset peripheral edema  Gastrointestinal:   Denies nausea, vomiting, diarrhea.  Genitourinary: Denies dysuria, freq/ urgency, flank pain or discharge from genitals.  Endocrine:     Denies hot or cold intolerance, polyuria, polydipsia. Musculoskeletal:   Denies unexplained myalgias, joint swelling, unexplained arthralgias, gait  problems.  Skin:  Denies rash, suspicious lesions Neurological:     Denies dizziness, unexplained weakness, numbness  Psychiatric/Behavioral:   Denies mood changes, suicidal or homicidal ideations, hallucinations    Objective:     Blood pressure 128/76, pulse 60, height 5' 10.5" (1.791 m), weight 233 lb 8 oz (105.9 kg). Body mass index is 33.03 kg/m. General Appearance:    Alert, cooperative, no distress, appears stated age  Head:    Normocephalic, without obvious abnormality, atraumatic  Eyes:    PERRL, conjunctiva/corneas clear, EOM's intact, fundi    benign, both eyes  Ears:    Normal TM's and external ear canals, both ears  Nose:   Nares normal, septum midline, mucosa normal, no drainage    or sinus tenderness  Throat:   Lips w/o lesion, mucosa moist, and tongue normal; teeth and   gums normal  Neck:   Supple, symmetrical, trachea midline, no adenopathy;    thyroid:  no enlargement/tenderness/nodules; no carotid   bruit or JVD  Back:     Symmetric, no curvature, ROM normal, no CVA tenderness  Lungs:     Clear to auscultation bilaterally, respirations unlabored, no       Wh/ R/ R  Chest Wall:    No  tenderness or gross deformity; normal excursion   Heart:    Regular rate and rhythm, S1 and S2 normal, no murmur, rub   or gallop  Abdomen:     Soft, non-tender, bowel sounds active all four quadrants, NO   G/R/R, no masses, no organomegaly  Genitalia:  defer  Rectal:  defer  Extremities:   Extremities normal, atraumatic, no cyanosis or gross edema  Pulses:   2+ and symmetric all extremities  Skin:   Warm, dry, Skin color, texture, turgor normal, no obvious rashes or lesions  M-Sk:   Ambulates * 4 w/o difficulty, no gross deformities, tone WNL  Neurologic:   CNII-XII intact, normal strength, sensation and reflexes    Throughout Psych:  No HI/SI, judgement and insight good, Euthymic mood. Full Affect.

## 2017-05-15 NOTE — Addendum Note (Signed)
Addended by: Marjory Sneddon on: 05/15/2017 05:03 PM   Modules accepted: Orders

## 2017-05-16 ENCOUNTER — Telehealth: Payer: Self-pay | Admitting: Family Medicine

## 2017-05-16 LAB — CBC WITH DIFFERENTIAL/PLATELET
BASOS: 0 %
Basophils Absolute: 0 10*3/uL (ref 0.0–0.2)
EOS (ABSOLUTE): 0.3 10*3/uL (ref 0.0–0.4)
EOS: 5 %
HEMATOCRIT: 49 % (ref 37.5–51.0)
HEMOGLOBIN: 16.1 g/dL (ref 13.0–17.7)
Immature Grans (Abs): 0 10*3/uL (ref 0.0–0.1)
Immature Granulocytes: 0 %
LYMPHS ABS: 1.1 10*3/uL (ref 0.7–3.1)
Lymphs: 20 %
MCH: 28 pg (ref 26.6–33.0)
MCHC: 32.9 g/dL (ref 31.5–35.7)
MCV: 85 fL (ref 79–97)
MONOCYTES: 8 %
MONOS ABS: 0.4 10*3/uL (ref 0.1–0.9)
NEUTROS ABS: 3.5 10*3/uL (ref 1.4–7.0)
Neutrophils: 67 %
Platelets: 155 10*3/uL (ref 150–379)
RBC: 5.75 x10E6/uL (ref 4.14–5.80)
RDW: 14.6 % (ref 12.3–15.4)
WBC: 5.3 10*3/uL (ref 3.4–10.8)

## 2017-05-16 LAB — MICROALBUMIN / CREATININE URINE RATIO
Creatinine, Urine: 163.3 mg/dL
MICROALB/CREAT RATIO: 2.8 mg/g{creat} (ref 0.0–30.0)
Microalbumin, Urine: 4.5 ug/mL

## 2017-05-16 LAB — IRON AND TIBC
Iron Saturation: 25 % (ref 15–55)
Iron: 91 ug/dL (ref 38–169)
Total Iron Binding Capacity: 361 ug/dL (ref 250–450)
UIBC: 270 ug/dL (ref 111–343)

## 2017-05-16 LAB — LIPID PANEL
CHOLESTEROL TOTAL: 181 mg/dL (ref 100–199)
Chol/HDL Ratio: 3.9 ratio (ref 0.0–5.0)
HDL: 46 mg/dL (ref >39)
LDL CALC: 108 mg/dL — AB (ref 0–99)
TRIGLYCERIDES: 134 mg/dL (ref 0–149)
VLDL Cholesterol Cal: 27 mg/dL (ref 5–40)

## 2017-05-16 LAB — COMPREHENSIVE METABOLIC PANEL
A/G RATIO: 1.8 (ref 1.2–2.2)
ALBUMIN: 4.4 g/dL (ref 3.5–4.8)
ALK PHOS: 87 IU/L (ref 39–117)
ALT: 23 IU/L (ref 0–44)
AST: 26 IU/L (ref 0–40)
BUN / CREAT RATIO: 17 (ref 10–24)
BUN: 18 mg/dL (ref 8–27)
Bilirubin Total: 0.4 mg/dL (ref 0.0–1.2)
CO2: 25 mmol/L (ref 20–29)
CREATININE: 1.07 mg/dL (ref 0.76–1.27)
Calcium: 9.2 mg/dL (ref 8.6–10.2)
Chloride: 105 mmol/L (ref 96–106)
GFR calc Af Amer: 78 mL/min/{1.73_m2} (ref >59)
GFR calc non Af Amer: 68 mL/min/{1.73_m2} (ref >59)
GLOBULIN, TOTAL: 2.5 g/dL (ref 1.5–4.5)
Glucose: 110 mg/dL — ABNORMAL HIGH (ref 65–99)
POTASSIUM: 5.2 mmol/L (ref 3.5–5.2)
SODIUM: 142 mmol/L (ref 134–144)
Total Protein: 6.9 g/dL (ref 6.0–8.5)

## 2017-05-16 LAB — FOLATE: Folate: >20 ng/mL (ref >3.0)

## 2017-05-16 LAB — VITAMIN B12: VITAMIN B 12: 521 pg/mL (ref 232–1245)

## 2017-05-16 LAB — FERRITIN: FERRITIN: 46 ng/mL (ref 30–400)

## 2017-05-16 LAB — HEMOGLOBIN A1C
ESTIMATED AVERAGE GLUCOSE: 128 mg/dL
HEMOGLOBIN A1C: 6.1 % — AB (ref 4.8–5.6)

## 2017-05-16 LAB — VITAMIN D 25 HYDROXY (VIT D DEFICIENCY, FRACTURES): VIT D 25 HYDROXY: 42.2 ng/mL (ref 30.0–100.0)

## 2017-05-16 LAB — TSH: TSH: 1.71 u[IU]/mL (ref 0.450–4.500)

## 2017-05-16 LAB — T4, FREE: FREE T4: 1.08 ng/dL (ref 0.82–1.77)

## 2017-05-16 NOTE — Telephone Encounter (Signed)
Patient called you back late Friday afternoon and said to return his call Monday and that you could leave any details on his phone if he does not pick up

## 2017-05-19 ENCOUNTER — Telehealth: Payer: Self-pay | Admitting: Family Medicine

## 2017-05-19 NOTE — Telephone Encounter (Signed)
I had previously called patient with lab results.  Called patient back left message for patient to call the office. MPulliam, CMA/RT(R)

## 2017-05-19 NOTE — Telephone Encounter (Signed)
Patient returned medical assistant call for the 2nd time-- a bit irritated .  glh

## 2017-05-19 NOTE — Telephone Encounter (Signed)
Patient called and notified of lab results. MPulliam, CMA/RT(R)

## 2017-05-19 NOTE — Telephone Encounter (Signed)
Patient called medical assistant back --- message left by office MA to call her--glh

## 2017-05-20 ENCOUNTER — Other Ambulatory Visit: Payer: Self-pay | Admitting: Family Medicine

## 2017-05-25 ENCOUNTER — Other Ambulatory Visit: Payer: Self-pay | Admitting: Family Medicine

## 2017-05-25 DIAGNOSIS — E781 Pure hyperglyceridemia: Secondary | ICD-10-CM

## 2017-05-26 ENCOUNTER — Telehealth: Payer: Self-pay | Admitting: Family Medicine

## 2017-05-26 NOTE — Telephone Encounter (Signed)
Called pt and the line gave a busy signal could not lvm.

## 2017-05-26 NOTE — Telephone Encounter (Signed)
Patient clld states drank water frm a source that was later told water was contaminated --Pt concerned should he get a Hepatitis vaccine-- forwarding message to medical assistant to contact patient [email or web address] [phone]  --glh

## 2017-05-27 NOTE — Telephone Encounter (Signed)
LVM for pt to call to discuss.  T. Nelson, CMA  

## 2017-05-28 NOTE — Telephone Encounter (Signed)
Spoke to the patient and advised per Mina Marble, NP to contact the health department. MPulliam, CMA/RT(R)

## 2017-05-28 NOTE — Telephone Encounter (Signed)
LVM for pt to call to discuss.  T. Nelson, CMA  

## 2017-06-03 ENCOUNTER — Ambulatory Visit: Payer: Medicare HMO

## 2017-06-03 ENCOUNTER — Ambulatory Visit
Admission: RE | Admit: 2017-06-03 | Discharge: 2017-06-03 | Disposition: A | Discharge status: Home / Self Care | Payer: Medicare HMO | Source: Ambulatory Visit | Attending: Family Medicine | Admitting: Family Medicine

## 2017-06-03 DIAGNOSIS — Z1389 Encounter for screening for other disorder: Secondary | ICD-10-CM

## 2017-06-03 DIAGNOSIS — Z87891 Personal history of nicotine dependence: Secondary | ICD-10-CM

## 2017-06-05 ENCOUNTER — Ambulatory Visit
Admission: RE | Admit: 2017-06-05 | Discharge: 2017-06-05 | Disposition: A | Discharge status: Home / Self Care | Payer: Medicare HMO | Source: Ambulatory Visit | Attending: Family Medicine | Admitting: Family Medicine

## 2017-06-05 ENCOUNTER — Telehealth: Payer: Self-pay | Admitting: *Deleted

## 2017-06-05 DIAGNOSIS — Z87891 Personal history of nicotine dependence: Secondary | ICD-10-CM

## 2017-06-05 DIAGNOSIS — I700 Atherosclerosis of aorta: Secondary | ICD-10-CM

## 2017-06-05 HISTORY — DX: Atherosclerosis of aorta: I70.0

## 2017-06-05 NOTE — Telephone Encounter (Signed)
CALLED PATIENT TO REMIND OF PRE-SEED APPTS. FOR 06-06-17, LVM FOR A RETURN CALL

## 2017-06-06 ENCOUNTER — Ambulatory Visit
Admission: RE | Admit: 2017-06-06 | Discharge: 2017-06-06 | Disposition: A | Discharge status: Home / Self Care | Payer: Medicare HMO | Source: Ambulatory Visit | Attending: Radiation Oncology | Admitting: Radiation Oncology

## 2017-06-06 DIAGNOSIS — C61 Malignant neoplasm of prostate: Secondary | ICD-10-CM | POA: Diagnosis not present

## 2017-06-06 NOTE — Progress Notes (Signed)
  Radiation Oncology         [phone]) [phone] ________________________________  Name: Terry Rogers MRN: [id number]  Date: 06/06/2017  DOB: 23-Jan-1941  SIMULATION AND TREATMENT PLANNING NOTE PUBIC ARCH STUDY  [id number], [name], DO  Irine Seal, MD  DIAGNOSIS: 76 y.o. gentleman with Stage T1c adenocarcinoma of the prostate with Gleason Score of 3+4, and PSA of 4.28    ICD-10-CM   1. Malignant neoplasm of prostate ([place]) C61     COMPLEX SIMULATION:  The patient presented today for evaluation for possible prostate seed implant. He was brought to the radiation planning suite and placed supine on the CT couch. A 3-dimensional image study set was obtained in upload to the planning computer. There, on each axial slice, I contoured the prostate gland. Then, using three-dimensional radiation planning tools I reconstructed the prostate in view of the structures from the transperineal needle pathway to assess for possible pubic arch interference. In doing so, I did not appreciate any pubic arch interference. Also, the patient's prostate volume was estimated based on the drawn structure. The volume was 44 cc.  Given the pubic arch appearance and prostate volume, patient remains a good candidate to proceed with prostate seed implant. Today, he freely provided informed written consent to proceed.    PLAN: The patient will undergo prostate seed implant.   ________________________________  Sheral Apley. Tammi Klippel, M.D.  This document serves as a record of services personally performed by Tyler Pita, MD. It was created on his behalf by Rae Lips, a trained medical scribe. The creation of this record is based on the scribe's personal observations and the provider's statements to them. This document has been checked and approved by the attending provider.

## 2017-06-11 ENCOUNTER — Other Ambulatory Visit: Payer: Self-pay | Admitting: Urology

## 2017-06-12 ENCOUNTER — Telehealth: Payer: Self-pay | Admitting: *Deleted

## 2017-06-12 NOTE — Telephone Encounter (Signed)
Called patient to inform of chest x-ray and EKG for 06-13-17- arrival time- 1:45 pm @ [facility] Admitting, and his new implant date on 09-11-17 @ 7:30 am, lvm for a return call

## 2017-06-13 ENCOUNTER — Telehealth: Payer: Self-pay | Admitting: *Deleted

## 2017-06-13 ENCOUNTER — Encounter (HOSPITAL_COMMUNITY)
Admission: RE | Admit: 2017-06-13 | Discharge: 2017-06-13 | Disposition: A | Discharge status: Home / Self Care | Payer: Medicare HMO | Source: Ambulatory Visit | Attending: Urology | Admitting: Urology

## 2017-06-13 ENCOUNTER — Ambulatory Visit (HOSPITAL_COMMUNITY)
Admission: RE | Admit: 2017-06-13 | Discharge: 2017-06-13 | Disposition: A | Discharge status: Home / Self Care | Payer: Medicare HMO | Source: Ambulatory Visit | Attending: Urology | Admitting: Urology

## 2017-06-13 ENCOUNTER — Encounter (HOSPITAL_COMMUNITY): Payer: Medicare HMO

## 2017-06-13 ENCOUNTER — Other Ambulatory Visit (HOSPITAL_COMMUNITY): Payer: Medicare HMO

## 2017-06-13 ENCOUNTER — Other Ambulatory Visit: Payer: Self-pay

## 2017-06-13 DIAGNOSIS — C61 Malignant neoplasm of prostate: Secondary | ICD-10-CM | POA: Diagnosis not present

## 2017-06-13 NOTE — Telephone Encounter (Signed)
CALLED PATIENT TO INFORM THAT CHEST X-RAY AND EKG WILL BE DONE @ 2 PM @ [facility], LVM FOR A RETURN CALL.

## 2017-07-09 DIAGNOSIS — J40 Bronchitis, not specified as acute or chronic: Secondary | ICD-10-CM | POA: Diagnosis not present

## 2017-08-15 ENCOUNTER — Encounter (HOSPITAL_COMMUNITY): Payer: Medicare HMO

## 2017-08-22 ENCOUNTER — Encounter (HOSPITAL_BASED_OUTPATIENT_CLINIC_OR_DEPARTMENT_OTHER): Payer: Self-pay

## 2017-08-25 ENCOUNTER — Encounter (HOSPITAL_BASED_OUTPATIENT_CLINIC_OR_DEPARTMENT_OTHER): Payer: Self-pay | Admitting: *Deleted

## 2017-08-31 ENCOUNTER — Encounter: Payer: Self-pay | Admitting: Family Medicine

## 2017-09-01 DIAGNOSIS — C61 Malignant neoplasm of prostate: Secondary | ICD-10-CM | POA: Diagnosis not present

## 2017-09-02 ENCOUNTER — Other Ambulatory Visit: Payer: Self-pay

## 2017-09-02 DIAGNOSIS — I10 Essential (primary) hypertension: Secondary | ICD-10-CM

## 2017-09-02 DIAGNOSIS — E782 Mixed hyperlipidemia: Secondary | ICD-10-CM

## 2017-09-02 DIAGNOSIS — E781 Pure hyperglyceridemia: Secondary | ICD-10-CM

## 2017-09-02 MED ORDER — ATENOLOL 50 MG PO TABS: 50.0000 mg | ORAL_TABLET | Freq: Every day | ORAL | 1 refills | Status: DC

## 2017-09-02 MED ORDER — EZETIMIBE 10 MG PO TABS: 10.0000 mg | ORAL_TABLET | Freq: Every day | ORAL | 0 refills | Status: DC

## 2017-09-02 MED ORDER — FENOFIBRATE 145 MG PO TABS: 145.0000 mg | ORAL_TABLET | Freq: Every day | ORAL | 1 refills | Status: DC

## 2017-09-02 NOTE — Telephone Encounter (Signed)
Patient requesting refills on Ezetmibe, Atenolol, and Fenofibrate - reviewed chart and sent in medication.  Patient also requesting refill on Ropinrole 1 mg which was last filled by a pervious provider.  This request was sent to Dr. Raliegh Scarlet for review. MPulliam, CMA/RT(R)

## 2017-09-03 ENCOUNTER — Telehealth: Payer: Self-pay | Admitting: *Deleted

## 2017-09-03 ENCOUNTER — Other Ambulatory Visit: Payer: Self-pay | Admitting: Family Medicine

## 2017-09-03 DIAGNOSIS — G2581 Restless legs syndrome: Secondary | ICD-10-CM

## 2017-09-03 NOTE — Telephone Encounter (Signed)
Called patient to inform of lab work on 09-05-17 - arrival time - 12:45 pm @ [facility] Admitting, lvm for a return call

## 2017-09-03 NOTE — Telephone Encounter (Signed)
Please confirm how patient takes it as I do not think it is 5-6 times per day.  Thanks for letting me know.

## 2017-09-03 NOTE — Telephone Encounter (Signed)
Melissa when I looked back through the chart it appears he is on an extended release tablet of this medicine.  Please confirm this with the patient and do not refill this short acting 1.  Thank you.

## 2017-09-04 ENCOUNTER — Other Ambulatory Visit: Payer: Self-pay

## 2017-09-04 ENCOUNTER — Encounter (HOSPITAL_BASED_OUTPATIENT_CLINIC_OR_DEPARTMENT_OTHER): Payer: Self-pay | Admitting: *Deleted

## 2017-09-04 ENCOUNTER — Other Ambulatory Visit (HOSPITAL_COMMUNITY): Payer: Medicare HMO

## 2017-09-04 NOTE — Progress Notes (Addendum)
Your procedure is scheduled on  ____________                                   Report to [place]. M.____                                    Call this number if you have problems the                                     morning of surgery  :[phone].                                   OUR ADDRESS IS [place].  WE ARE LOCATED IN [facility].                                                                      REMEMBER:                                   DO NOT EAT FOOD OR DRINK LIQUIDS AFTER MIDNIGHT                                   TAKE THESE MEDICATIONS MORNING OF SURGERY WITH A  SIP OF WATER:  __________________________________                                                                      DO NOT WEAR JEWERLY, MAKE UP, OR NAIL POLISH,                                                                       DO NOT WEAR LOTIONS, POWDERS, PERFUMES OR                                    DEODORANT.  DO NOT SHAVE FOR 24 HOURS BEFORE COMING TO [place]. MEN MAY SHAVE FACE AND NECK.                                                                      [facility] IS NOT RESPONSIBLE  FOR ANY  BELONGINGS                                                                         CONTACTS, DENTURES, OR BRIDEWORK MAY NOT BE  WORN INTO SURGERY.                                                                      Marland Kitchen

## 2017-09-04 NOTE — Progress Notes (Addendum)
Your procedure is scheduled on  Thursday, 09-11-2017                                   Report to [facility] [place] 5:30 AM  Call this number if you have problems the  morning of surgery :[phone].  OUR ADDRESS IS [place].    WE ARE LOCATED IN [facility].  ** Do one fleet enema am day of surgery**                                                                      REMEMBER: DO NOT EAT FOOD OR DRINK LIQUIDS AFTER MIDNIGHT TAKE THESE MEDICATIONS MORNING OF SURGERY WITH A SIP OF WATER: Atenolol   DO NOT WEAR JEWERLY, MAKE UP, OR NAIL POLISH,   DO NOT WEAR LOTIONS, POWDERS, PERFUMES OR DEODORANT.   DO NOT SHAVE FOR 24 HOURS BEFORE COMING TO THE HOSPITAL   MEN MAY SHAVE FACE AND NECK.  [facility] IS NOT RESPONSIBLE  FOR ANY  BELONGINGS                                         CONTACTS, DENTURES, OR BRIDEWORK MAY NOT  WORN INTO SURGERY.                                                                     Marland Kitchen

## 2017-09-04 NOTE — Progress Notes (Addendum)
SPOKE W/ PT VIA PHONE FOR PRE-OP INTERVIEW.  NPO AFTER MN.  ARRIVE AT 0530.  GETTING LAB WORK DONE Monday 09-08-2017 AT 1400 (CBC,CMET,PT/INR,PTT).  CURRENT CXR AND EKG IN CHART AND Epic.  WILL TAKE ATENOLOL AM DOS W/ SIPS OF WATER AND DO FLEET ENEMA.  PT REQUESTED INSTRUCTIONS TO BE WRITTEN AND GIVEN TO HIM WHEN HE COMES FOR LAB WORK ON Monday.  WRITTEN INSTRUCTIONS DONE W/ SMART PHRASES AND PRINTED TO GIVE TO PT.

## 2017-09-04 NOTE — Progress Notes (Signed)
Your procedure is scheduled on  ____________  Report to [place]. M._____   Call this number if you have problems the morning of surgery  :[phone].   OUR ADDRESS IS [place].  WE ARE [facility].                                     REMEMBER:  DO NOT EAT FOOD OR DRINK LIQUIDS AFTER MIDNIGHT .  TAKE THESE MEDICATIONS MORNING OF SURGERY WITH A SIP OF WATER:  __________________________________                                    DO NOT WEAR JEWERLY, MAKE UP, OR NAIL POLISH,  DO NOT WEAR LOTIONS, POWDERS, PERFUMES OR DEODORANT. DO NOT SHAVE FOR 24 HOURS PRIOR TO DAY OF SURGERY. MEN MAY SHAVE FACE AND NECK. CONTACTS, GLASSES, OR DENTURES MAY NOT BE WORN TO SURGERY.                                    [facility] IS NOT RESPONSIBLE  FOR ANY BELONGINGS.                                                                    Marland Kitchen

## 2017-09-05 ENCOUNTER — Other Ambulatory Visit (HOSPITAL_COMMUNITY): Payer: Medicare HMO

## 2017-09-05 NOTE — Telephone Encounter (Signed)
Terry Rogers I got this back from you yesterday but not sure why you sent it back as there is no response regarding the patient's Requip prescription.  Please see my prior questions to you below in blue which were not answered yet.   In the chart, he is not on this short acting Requip which this refill request is for thus, its a different medication than what is documented that the patient is taking in the chart.    Please contact the patient to confirm exactly what he is on and if he needs a new prescription for this or the other med he is on or not.  Thanks, Dr Jeri Lager when I looked back through the chart it appears he is on an extended release tablet of this medicine.  Please confirm this with the patient and do not refill this short acting 1.  Thank you.      Documentation     You  Pc [facility] 2 days ago     Please confirm how patient takes it as I do not think it is 5-6 times per day.  Thanks for letting me know.

## 2017-09-08 ENCOUNTER — Encounter (HOSPITAL_COMMUNITY)
Admission: RE | Admit: 2017-09-08 | Discharge: 2017-09-08 | Disposition: A | Discharge status: Home / Self Care | Payer: Medicare HMO | Source: Ambulatory Visit | Attending: Urology | Admitting: Urology

## 2017-09-08 ENCOUNTER — Telehealth: Payer: Self-pay | Admitting: Family Medicine

## 2017-09-08 ENCOUNTER — Telehealth: Payer: Self-pay

## 2017-09-08 DIAGNOSIS — I10 Essential (primary) hypertension: Secondary | ICD-10-CM | POA: Diagnosis not present

## 2017-09-08 DIAGNOSIS — Z87891 Personal history of nicotine dependence: Secondary | ICD-10-CM | POA: Diagnosis not present

## 2017-09-08 DIAGNOSIS — I739 Peripheral vascular disease, unspecified: Secondary | ICD-10-CM | POA: Diagnosis not present

## 2017-09-08 DIAGNOSIS — C61 Malignant neoplasm of prostate: Secondary | ICD-10-CM | POA: Diagnosis present

## 2017-09-08 DIAGNOSIS — N501 Vascular disorders of male genital organs: Secondary | ICD-10-CM | POA: Diagnosis not present

## 2017-09-08 DIAGNOSIS — J449 Chronic obstructive pulmonary disease, unspecified: Secondary | ICD-10-CM | POA: Diagnosis not present

## 2017-09-08 DIAGNOSIS — G2581 Restless legs syndrome: Secondary | ICD-10-CM | POA: Diagnosis not present

## 2017-09-08 DIAGNOSIS — Z79899 Other long term (current) drug therapy: Secondary | ICD-10-CM | POA: Diagnosis not present

## 2017-09-08 LAB — COMPREHENSIVE METABOLIC PANEL
ALK PHOS: 78 U/L (ref 38–126)
ALT: 16 U/L — AB (ref 17–63)
ANION GAP: 7 (ref 5–15)
AST: 25 U/L (ref 15–41)
Albumin: 4 g/dL (ref 3.5–5.0)
BUN: 19 mg/dL (ref 6–20)
CALCIUM: 9.1 mg/dL (ref 8.9–10.3)
CO2: 26 mmol/L (ref 22–32)
Chloride: 107 mmol/L (ref 101–111)
Creatinine, Ser: 1.28 mg/dL — ABNORMAL HIGH (ref 0.61–1.24)
GFR, EST NON AFRICAN AMERICAN: 53 mL/min — AB (ref >60)
Glucose, Bld: 87 mg/dL (ref 65–99)
Potassium: 4.6 mmol/L (ref 3.5–5.1)
SODIUM: 140 mmol/L (ref 135–145)
TOTAL PROTEIN: 6.8 g/dL (ref 6.5–8.1)
Total Bilirubin: 0.8 mg/dL (ref 0.3–1.2)

## 2017-09-08 LAB — CBC
HCT: 50 % (ref 39.0–52.0)
HEMOGLOBIN: 16.4 g/dL (ref 13.0–17.0)
MCH: 28.3 pg (ref 26.0–34.0)
MCHC: 32.8 g/dL (ref 30.0–36.0)
MCV: 86.4 fL (ref 78.0–100.0)
Platelets: 136 10*3/uL — ABNORMAL LOW (ref 150–400)
RBC: 5.79 MIL/uL (ref 4.22–5.81)
RDW: 14.6 % (ref 11.5–15.5)
WBC: 5.4 10*3/uL (ref 4.0–10.5)

## 2017-09-08 LAB — APTT: aPTT: 29 seconds (ref 24–36)

## 2017-09-08 LAB — PROTIME-INR
INR: 1
PROTHROMBIN TIME: 13.1 s (ref 11.4–15.2)

## 2017-09-08 NOTE — Telephone Encounter (Signed)
error 

## 2017-09-08 NOTE — Telephone Encounter (Signed)
Patient called about his refill on Ropinirole 1 mg.  Patient states that he takes the medication 5 times a day and also takes the 2 mg XL of the medication at night before bed. MPulliam, CMA/RT(R)

## 2017-09-08 NOTE — Progress Notes (Signed)
Routed CBC results dated 09-08-2017 to Dr Jeffie Pollock in [place].

## 2017-09-09 ENCOUNTER — Other Ambulatory Visit: Payer: Self-pay

## 2017-09-09 ENCOUNTER — Telehealth: Payer: Self-pay | Admitting: Family Medicine

## 2017-09-09 DIAGNOSIS — C61 Malignant neoplasm of prostate: Secondary | ICD-10-CM | POA: Diagnosis not present

## 2017-09-09 MED ORDER — ROPINIROLE HCL 1 MG PO TABS: ORAL_TABLET | ORAL | 0 refills | Status: DC

## 2017-09-09 NOTE — Telephone Encounter (Signed)
Patient called states he received a call frm [employer] mail order pharmacy advising him it could take up to 2 week for :   Medication Detail    Disp Refills Start End   rOPINIRole (REQUIP) 1 MG tablet 450 tablet 0 09/09/2017    Sig: 1 tablet by mouth 5 times daily prn   Sent to pharmacy as: rOPINIRole (REQUIP) 1 MG tablet   E-Prescribing Status: Receipt confirmed by pharmacy (09/09/2017 3:48 PM EST)    Patient ask that provider/ medical assistant send a Rx for a (small supply to:    [id number] [address], [place] [phone] (Phone) [phone] (Fax)     --Patient request MA call him to let him know it has been done.  --glh

## 2017-09-09 NOTE — Telephone Encounter (Signed)
Please make sure chart accurately reflects what patient is taking.  Thank you.  Okay to refill medications for 90-day prescription as patient takes them.

## 2017-09-09 NOTE — Telephone Encounter (Addendum)
Dr. Raliegh Scarlet approved and instructed to refill the Ropinirole 1mg  as the patient states that he takes (5 times a day).  Refill sent in and patient notified.  Please see previous note in chart form Dr. Raliegh Scarlet. MPulliam, CMA/RT(R)   Sent in a small supply to local pharmacy, while patient waits for mail order.  Patient notified. MPulliam, CMA/RT(R)

## 2017-09-09 NOTE — Telephone Encounter (Signed)
Refill sent in for the patient and notified the patient. MPulliam, CMA/RT(R)

## 2017-09-09 NOTE — Telephone Encounter (Signed)
Sent medication in and left message to notify patient. MPulliam, CMA/RT(R)

## 2017-09-10 ENCOUNTER — Telehealth: Payer: Self-pay | Admitting: *Deleted

## 2017-09-10 NOTE — Telephone Encounter (Signed)
Called patient to remind of implant for 09-11-17, lvm for a return call

## 2017-09-10 NOTE — H&P (Signed)
CC/HPI: Pt presents today for pre-operative history and physical exam in anticipation of radioactive seed implant and space oar placement by Dr. Jeffie Pollock on 09/11/17. He is doing well and is without complaint. Pt denies F/C, HA, CP, SOB, N/V, diarrhea/constipation, back pain, flank pain, hematuria, and dysuria.    HX:     CC/HPI: I have prostate cancer.     Terry Rogers returns today to discuss his prostate biopsy results. he was biopsied for a PSA of 4.28. His prostate was firm but without nodules. He was found to have a 10ml prostate with a 2 cores in the left lobe with Gleason 7(3+4) disease. 40% in the left mid medial and 5% in the left apical medial. He had 2 cores of atypia. His IPSS is 6 and his SHIM is 10. He has been dealing with ED and no longer responds to oral agents and had priapism with trimix. He apparently tried LI-EST in [place] with some benefit. He has been treated for hypogonadism but is off of TRT at this time.   The [facility] nomogram predicts 56% OCD, 43% ECE, 2% SVI and 2% LNI.    ALLERGIES: Sulfa Drugs - unknown reaction as a kid    MEDICATIONS: Aleve CAPS Oral  Atenolol  Centrum Silver 400 mcg-250 mcg tablet,chewable Oral  Exel Syringe 21 gauge x 1 1/2" syringe, empty disposable 1 syringe IM Q2WK  Fenofibrate  Omega-3 CAPS Oral  Ropinirole Hcl 3 mg tablet Oral  Sildenafil 20 mg tablet 1-5 tablets as needed 1 hour prior to intimacy  Triamcinolone Acetonide 0.025 % cream  Zetia     GU PSH: Prostate Needle Biopsy - 02/20/2017      PSH Notes: Rotator Cuff Repair x 4  left knee scope  wisdom teeth removed   NON-GU PSH: Surgical Pathology, Gross And Microscopic Examination For Prostate Needle - 02/20/2017    GU PMH: ED due to arterial insufficiency - 04/02/2017 Prostate Cancer, T1c Nx Mx Gleason 7(3+4) low volume disease with minimal LUTs and moderately severe ED. After a review of the treatment options, I think he would be a candidate for surveillance if he were so  inclined but I would want to get an Oncotype Dx first. I think he would be a very good candidate for brachytherapy but could be considered for EXRT. He is a surgical candidate but he is out my usually recommended age range. I am going to have him see Dr. Tammi Klippel about seeds but if he elects surveillance, I will get the Oncotype and have him return in 3 months with a PSA before he heads to [place] for the Winter. - 04/02/2017 Elevated PSA - 02/20/2017, - 01/01/2017 Primary hypogonadism - 01/01/2017, Hypogonadism, testicular, - 10/31/2015 Male ED, unspecified, Erectile dysfunction - 04/28/2015      PMH Notes: Restless leg  spondylitis  hyperlipidemia   NON-GU PMH: Encounter for general adult medical examination without abnormal findings, Encounter for preventive health examination - 04/28/2015 Personal history of other diseases of the circulatory system, History of hypertension - October 14, 2013 Personal history of other diseases of the digestive system, History of esophageal reflux - 14-Oct-2013 Personal history of other diseases of the musculoskeletal system and connective tissue, History of ankylosing spondylitis - 10-14-2013 Personal history of other endocrine, nutritional and metabolic disease, History of hypercholesterolemia - 10/14/2013    FAMILY HISTORY: Death - Mother, Father Deceased - Runs In Family   SOCIAL HISTORY: Marital Status: Married Preferred Language: English; Ethnicity: Not Hispanic Or Latino; Race: White Current Smoking  Status: Patient does not smoke anymore. Smoked for 45 years.  Does not use smokeless tobacco. Social Drinker.  Does not use drugs. Does not drink caffeine. Has not had a blood transfusion.     Notes: Former smoker, Married, Caffeine use, Alcohol use 2 glasses of wine/day, Uses nicotine gum;    REVIEW OF SYSTEMS:    GU Review Male:   Patient denies trouble starting your stream, erection problems, frequent urination, burning/ pain with urination, get up at night to urinate, stream  starts and stops, have to strain to urinate , leakage of urine, hard to postpone urination, and penile pain.  Gastrointestinal (Upper):   Patient reports indigestion/ heartburn. Patient denies nausea and vomiting.  Gastrointestinal (Lower):   Patient denies constipation.  Constitutional:   Patient denies fever, night sweats, weight loss, and fatigue.  Skin:   Patient denies skin rash/ lesion and itching.  Eyes:   Patient denies blurred vision and double vision.  Ears/ Nose/ Throat:   Patient denies sore throat and sinus problems.  Hematologic/Lymphatic:   Patient denies swollen glands and easy bruising.  Cardiovascular:   Patient denies leg swelling and chest pains.  Respiratory:   Patient denies cough and shortness of breath.  Endocrine:   Patient denies excessive thirst.  Musculoskeletal:   Patient denies back pain and joint pain.  Neurological:   Patient denies headaches and dizziness.  Psychologic:   Patient denies depression and anxiety.   VITAL SIGNS:      09/09/2017 02:05 PM  Weight 222 lb / 100.7 kg  Height 71 in / 180.34 cm  BP 142/79 mmHg  Pulse 56 /min  Temperature 98.0 F / 36.6 C  BMI 31.0 kg/m   MULTI-SYSTEM PHYSICAL EXAMINATION:    Constitutional: Well-nourished. No physical deformities. Normally developed. Good grooming.  Neck: Neck symmetrical, not swollen. Normal tracheal position.  Respiratory: Normal breath sounds. No labored breathing, no use of accessory muscles.   Cardiovascular: Regular rate and rhythm. No murmur, no gallop. Normal temperature, normal extremity pulses, no swelling, no varicosities.   Lymphatic: No enlargement of neck, axillae, groin.  Skin: No paleness, no jaundice, no cyanosis. No lesion, no ulcer, no rash.  Neurologic / Psychiatric: Oriented to time, oriented to place, oriented to person. No depression, no anxiety, no agitation.  Gastrointestinal: No mass, no tenderness, no rigidity, non obese abdomen.  Eyes: Normal conjunctivae. Normal  eyelids.  Ears, Nose, Mouth, and Throat: Left ear no scars, no lesions, no masses. Right ear no scars, no lesions, no masses. Nose no scars, no lesions, no masses. Normal hearing. Normal lips.  Musculoskeletal: Normal gait and station of head and neck.     PAST DATA REVIEWED:  Source Of History:  Patient  Records Review:   Previous Patient Records  Urine Test Review:   Urinalysis   02/20/17  PSA  Total PSA 4.28 ng/mL  Free PSA 1.32 ng/mL  % Free PSA 31 % PSA    PROCEDURES: None   ASSESSMENT:      ICD-10 Details  1 GU:   Prostate Cancer - C61    PLAN:           Schedule Return Visit/Planned Activity: Keep Scheduled Appointment - Schedule Surgery          Document Letter(s):  Created for Patient: Clinical Summary         Notes:   There are no changes in the patients history or physical exam since last evaluation by Dr. Jeffie Pollock. Pt is scheduled  to undergo radioactive seed implant and space oar placement 09/11/17. Pt was unable to provide a urine specimen.            Next Appointment:      Next Appointment: 09/11/2017 07:30 AM    Appointment Type: Surgery     Location: [facility] [phone]    Provider: Irine Seal, M.D.    Reason for Visit: [place]

## 2017-09-11 ENCOUNTER — Ambulatory Visit (HOSPITAL_BASED_OUTPATIENT_CLINIC_OR_DEPARTMENT_OTHER)
Admission: RE | Admit: 2017-09-11 | Discharge: 2017-09-11 | Disposition: A | Discharge status: Home / Self Care | Payer: Medicare HMO | Source: Ambulatory Visit | Attending: Urology | Admitting: Urology

## 2017-09-11 ENCOUNTER — Ambulatory Visit (HOSPITAL_BASED_OUTPATIENT_CLINIC_OR_DEPARTMENT_OTHER): Payer: Medicare HMO | Admitting: Anesthesiology

## 2017-09-11 ENCOUNTER — Encounter (HOSPITAL_BASED_OUTPATIENT_CLINIC_OR_DEPARTMENT_OTHER): Payer: Self-pay

## 2017-09-11 ENCOUNTER — Ambulatory Visit (HOSPITAL_COMMUNITY): Payer: Medicare HMO

## 2017-09-11 ENCOUNTER — Encounter (HOSPITAL_BASED_OUTPATIENT_CLINIC_OR_DEPARTMENT_OTHER)
Admission: RE | Disposition: A | Discharge status: Home / Self Care | Payer: Self-pay | Source: Ambulatory Visit | Attending: Urology

## 2017-09-11 DIAGNOSIS — Z87891 Personal history of nicotine dependence: Secondary | ICD-10-CM | POA: Diagnosis not present

## 2017-09-11 DIAGNOSIS — I10 Essential (primary) hypertension: Secondary | ICD-10-CM | POA: Insufficient documentation

## 2017-09-11 DIAGNOSIS — K219 Gastro-esophageal reflux disease without esophagitis: Secondary | ICD-10-CM | POA: Diagnosis not present

## 2017-09-11 DIAGNOSIS — I739 Peripheral vascular disease, unspecified: Secondary | ICD-10-CM | POA: Insufficient documentation

## 2017-09-11 DIAGNOSIS — J449 Chronic obstructive pulmonary disease, unspecified: Secondary | ICD-10-CM | POA: Diagnosis not present

## 2017-09-11 DIAGNOSIS — Z79899 Other long term (current) drug therapy: Secondary | ICD-10-CM | POA: Diagnosis not present

## 2017-09-11 DIAGNOSIS — N501 Vascular disorders of male genital organs: Secondary | ICD-10-CM | POA: Insufficient documentation

## 2017-09-11 DIAGNOSIS — J309 Allergic rhinitis, unspecified: Secondary | ICD-10-CM | POA: Diagnosis not present

## 2017-09-11 DIAGNOSIS — C61 Malignant neoplasm of prostate: Secondary | ICD-10-CM | POA: Diagnosis not present

## 2017-09-11 DIAGNOSIS — G2581 Restless legs syndrome: Secondary | ICD-10-CM | POA: Diagnosis not present

## 2017-09-11 DIAGNOSIS — N529 Male erectile dysfunction, unspecified: Secondary | ICD-10-CM | POA: Diagnosis not present

## 2017-09-11 HISTORY — DX: Personal history of urinary calculi: Z87.442

## 2017-09-11 HISTORY — PX: RADIOACTIVE SEED IMPLANT: SHX5150

## 2017-09-11 HISTORY — DX: Emphysema, unspecified: J43.9

## 2017-09-11 HISTORY — DX: Atherosclerosis of aorta: I70.0

## 2017-09-11 HISTORY — DX: Allergic rhinitis, unspecified: J30.9

## 2017-09-11 HISTORY — DX: Iron deficiency anemia, unspecified: D50.9

## 2017-09-11 HISTORY — DX: Prediabetes: R73.03

## 2017-09-11 HISTORY — DX: Spondylolisthesis, site unspecified: M43.10

## 2017-09-11 HISTORY — DX: Unspecified osteoarthritis, unspecified site: M19.90

## 2017-09-11 HISTORY — DX: Fatty (change of) liver, not elsewhere classified: K76.0

## 2017-09-11 HISTORY — DX: Benign prostatic hyperplasia without lower urinary tract symptoms: N40.0

## 2017-09-11 HISTORY — DX: Testicular hypofunction: E29.1

## 2017-09-11 HISTORY — DX: Ankylosing spondylitis of unspecified sites in spine: M45.9

## 2017-09-11 HISTORY — DX: Other nonspecific abnormal finding of lung field: R91.8

## 2017-09-11 HISTORY — PX: SPACE OAR INSTILLATION: SHX6769

## 2017-09-11 HISTORY — DX: Atherosclerotic heart disease of native coronary artery without angina pectoris: I25.10

## 2017-09-11 HISTORY — DX: Restless legs syndrome: G25.81

## 2017-09-11 HISTORY — DX: Decreased white blood cell count, unspecified: D72.819

## 2017-09-11 HISTORY — PX: CYSTOSCOPY: SHX5120

## 2017-09-11 HISTORY — DX: Secondary polycythemia: D75.1

## 2017-09-11 SURGERY — INSERTION, RADIATION SOURCE, PROSTATE
Anesthesia: General

## 2017-09-11 MED ORDER — FENTANYL CITRATE (PF) 100 MCG/2ML IJ SOLN
25.0000 ug | INTRAMUSCULAR | Status: DC | PRN
  Filled 2017-09-11: qty 1

## 2017-09-11 MED ORDER — ONDANSETRON HCL 4 MG/2ML IJ SOLN
4.0000 mg | Freq: Four times a day (QID) | INTRAMUSCULAR | Status: DC | PRN
  Filled 2017-09-11: qty 2

## 2017-09-11 MED ORDER — LIDOCAINE 2% (20 MG/ML) 5 ML SYRINGE
INTRAMUSCULAR | Status: AC
  Filled 2017-09-11: qty 5

## 2017-09-11 MED ORDER — OXYCODONE HCL 5 MG PO TABS
5.0000 mg | ORAL_TABLET | ORAL | Status: DC | PRN
  Filled 2017-09-11: qty 2

## 2017-09-11 MED ORDER — OXYCODONE HCL 5 MG/5ML PO SOLN
5.0000 mg | Freq: Once | ORAL | Status: DC | PRN
  Filled 2017-09-11: qty 5

## 2017-09-11 MED ORDER — DEXAMETHASONE SODIUM PHOSPHATE 4 MG/ML IJ SOLN
INTRAMUSCULAR | Status: DC | PRN
  Administered 2017-09-11: 10 mg via INTRAVENOUS

## 2017-09-11 MED ORDER — ACETAMINOPHEN 325 MG PO TABS
650.0000 mg | ORAL_TABLET | ORAL | Status: DC | PRN
  Filled 2017-09-11: qty 2

## 2017-09-11 MED ORDER — NYSTATIN 100000 UNIT/GM EX POWD: Freq: Three times a day (TID) | CUTANEOUS | 0 refills | Status: DC

## 2017-09-11 MED ORDER — PROPOFOL 10 MG/ML IV BOLUS
INTRAVENOUS | Status: AC
  Filled 2017-09-11: qty 40

## 2017-09-11 MED ORDER — SODIUM CHLORIDE 0.9% FLUSH
3.0000 mL | Freq: Two times a day (BID) | INTRAVENOUS | Status: DC
  Filled 2017-09-11: qty 3

## 2017-09-11 MED ORDER — FLUCONAZOLE 150 MG PO TABS: 150.0000 mg | ORAL_TABLET | Freq: Every day | ORAL | 0 refills | Status: DC

## 2017-09-11 MED ORDER — SODIUM CHLORIDE 0.9% FLUSH
3.0000 mL | INTRAVENOUS | Status: DC | PRN
  Filled 2017-09-11: qty 3

## 2017-09-11 MED ORDER — FENTANYL CITRATE (PF) 100 MCG/2ML IJ SOLN
INTRAMUSCULAR | Status: DC | PRN
  Administered 2017-09-11 (×4): 25 ug via INTRAVENOUS

## 2017-09-11 MED ORDER — MORPHINE SULFATE (PF) 2 MG/ML IV SOLN
2.0000 mg | INTRAVENOUS | Status: DC | PRN
  Filled 2017-09-11: qty 1

## 2017-09-11 MED ORDER — LACTATED RINGERS IV SOLN
INTRAVENOUS | Status: DC
  Administered 2017-09-11 (×2): via INTRAVENOUS
  Filled 2017-09-11: qty 1000

## 2017-09-11 MED ORDER — FENTANYL CITRATE (PF) 100 MCG/2ML IJ SOLN
INTRAMUSCULAR | Status: AC
  Filled 2017-09-11: qty 2

## 2017-09-11 MED ORDER — ACETAMINOPHEN 650 MG RE SUPP
650.0000 mg | RECTAL | Status: DC | PRN
  Filled 2017-09-11: qty 1

## 2017-09-11 MED ORDER — ONDANSETRON HCL 4 MG/2ML IJ SOLN
INTRAMUSCULAR | Status: AC
  Filled 2017-09-11: qty 2

## 2017-09-11 MED ORDER — SODIUM CHLORIDE 0.9 % IV SOLN
250.0000 mL | INTRAVENOUS | Status: DC | PRN
  Filled 2017-09-11: qty 250

## 2017-09-11 MED ORDER — FLEET ENEMA 7-19 GM/118ML RE ENEM
1.0000 | ENEMA | Freq: Once | RECTAL | Status: AC
  Administered 2017-09-11: 1 via RECTAL
  Filled 2017-09-11: qty 1

## 2017-09-11 MED ORDER — PROPOFOL 10 MG/ML IV BOLUS
INTRAVENOUS | Status: DC | PRN
  Administered 2017-09-11: 200 mg via INTRAVENOUS

## 2017-09-11 MED ORDER — LIDOCAINE 2% (20 MG/ML) 5 ML SYRINGE
INTRAMUSCULAR | Status: DC | PRN
  Administered 2017-09-11: 80 mg via INTRAVENOUS

## 2017-09-11 MED ORDER — OXYCODONE HCL 5 MG PO TABS
5.0000 mg | ORAL_TABLET | Freq: Once | ORAL | Status: DC | PRN
  Filled 2017-09-11: qty 1

## 2017-09-11 MED ORDER — KETOROLAC TROMETHAMINE 30 MG/ML IJ SOLN
INTRAMUSCULAR | Status: DC | PRN
  Administered 2017-09-11: 30 mg via INTRAVENOUS

## 2017-09-11 MED ORDER — DEXAMETHASONE SODIUM PHOSPHATE 10 MG/ML IJ SOLN
INTRAMUSCULAR | Status: AC
  Filled 2017-09-11: qty 1

## 2017-09-11 MED ORDER — KETOROLAC TROMETHAMINE 30 MG/ML IJ SOLN
INTRAMUSCULAR | Status: AC
  Filled 2017-09-11: qty 1

## 2017-09-11 MED ORDER — CIPROFLOXACIN IN D5W 400 MG/200ML IV SOLN
INTRAVENOUS | Status: AC
  Filled 2017-09-11: qty 200

## 2017-09-11 MED ORDER — TRAMADOL HCL 50 MG PO TABS: 50.0000 mg | ORAL_TABLET | Freq: Four times a day (QID) | ORAL | 0 refills | Status: DC | PRN

## 2017-09-11 MED ORDER — CIPROFLOXACIN IN D5W 400 MG/200ML IV SOLN
400.0000 mg | INTRAVENOUS | Status: AC
  Administered 2017-09-11: 400 mg via INTRAVENOUS
  Filled 2017-09-11: qty 200

## 2017-09-11 MED ORDER — ONDANSETRON HCL 4 MG/2ML IJ SOLN
INTRAMUSCULAR | Status: DC | PRN
  Administered 2017-09-11: 4 mg via INTRAVENOUS

## 2017-09-11 MED ORDER — SODIUM CHLORIDE 0.9 % IJ SOLN
INTRAMUSCULAR | Status: DC | PRN
  Administered 2017-09-11: 10 mL via INTRAVENOUS

## 2017-09-11 MED ORDER — IOHEXOL 300 MG/ML  SOLN
INTRAMUSCULAR | Status: DC | PRN
  Administered 2017-09-11: 5 mL

## 2017-09-11 SURGICAL SUPPLY — 30 items
BAG URINE DRAINAGE (UROLOGICAL SUPPLIES) ×3 IMPLANT
BLADE CLIPPER SURG (BLADE) ×3 IMPLANT
CATH FOLEY 2WAY SLVR  5CC 16FR (CATHETERS) ×1
CATH FOLEY 2WAY SLVR 5CC 16FR (CATHETERS) ×2 IMPLANT
CATH ROBINSON RED A/P 16FR (CATHETERS) IMPLANT
CATH ROBINSON RED A/P 20FR (CATHETERS) ×3 IMPLANT
CLOTH BEACON ORANGE TIMEOUT ST (SAFETY) ×3 IMPLANT
COVER BACK TABLE 60X90IN (DRAPES) ×3 IMPLANT
COVER MAYO STAND STRL (DRAPES) ×3 IMPLANT
DRSG TEGADERM 4X4.75 (GAUZE/BANDAGES/DRESSINGS) ×5 IMPLANT
DRSG TEGADERM 8X12 (GAUZE/BANDAGES/DRESSINGS) ×7 IMPLANT
GAUZE SPONGE 4X4 12PLY STRL LF (GAUZE/BANDAGES/DRESSINGS) ×1 IMPLANT
GLOVE ECLIPSE 8.0 STRL XLNG CF (GLOVE) ×3 IMPLANT
GLOVE SURG SS PI 8.0 STRL IVOR (GLOVE) ×6 IMPLANT
GOWN STRL REUS W/ TWL XL LVL3 (GOWN DISPOSABLE) ×2 IMPLANT
GOWN STRL REUS W/TWL XL LVL3 (GOWN DISPOSABLE) ×6 IMPLANT
HOLDER FOLEY CATH W/STRAP (MISCELLANEOUS) ×3 IMPLANT
IMPL SPACEOAR SYSTEM 10ML (MISCELLANEOUS) ×2 IMPLANT
IMPLANT SPACEOAR SYSTEM 10ML (MISCELLANEOUS) ×3
IV NS 1000ML (IV SOLUTION) ×3
IV NS 1000ML BAXH (IV SOLUTION) ×2 IMPLANT
KIT TURNOVER CYSTO (KITS) ×3 IMPLANT
PACK CYSTO (CUSTOM PROCEDURE TRAY) ×3 IMPLANT
SURGILUBE 2OZ TUBE FLIPTOP (MISCELLANEOUS) IMPLANT
SUT BONE WAX W31G (SUTURE) ×3 IMPLANT
SYRINGE 10CC LL (SYRINGE) ×1 IMPLANT
UNDERPAD 30X30 (UNDERPADS AND DIAPERS) ×6 IMPLANT
WATER STERILE IRR 3000ML UROMA (IV SOLUTION) ×3 IMPLANT
WATER STERILE IRR 500ML POUR (IV SOLUTION) ×3 IMPLANT
selectSeed I-125 ×1 IMPLANT

## 2017-09-11 NOTE — Anesthesia Preprocedure Evaluation (Signed)
Anesthesia Evaluation  Patient identified by MRN, date of birth, ID band Patient awake    Reviewed: Allergy & Precautions, H&P , NPO status , Patient's Chart, lab work & pertinent test results  Airway Mallampati: II   Neck ROM: full    Dental   Pulmonary COPD, former smoker,    breath sounds clear to auscultation       Cardiovascular hypertension, + Peripheral Vascular Disease   Rhythm:regular Rate:Normal     Neuro/Psych    GI/Hepatic GERD  ,  Endo/Other    Renal/GU    Prostate CA    Musculoskeletal  (+) Arthritis ,   Abdominal   Peds  Hematology   Anesthesia Other Findings   Reproductive/Obstetrics                             Anesthesia Physical Anesthesia Plan  ASA: III  Anesthesia Plan: General   Post-op Pain Management:    Induction: Intravenous  PONV Risk Score and Plan: 2 and Ondansetron and Dexamethasone  Airway Management Planned: LMA  Additional Equipment:   Intra-op Plan:   Post-operative Plan:   Informed Consent: I have reviewed the patients History and Physical, chart, labs and discussed the procedure including the risks, benefits and alternatives for the proposed anesthesia with the patient or authorized representative who has indicated his/her understanding and acceptance.     Plan Discussed with: CRNA, Anesthesiologist and Surgeon  Anesthesia Plan Comments:         Anesthesia Quick Evaluation

## 2017-09-11 NOTE — Op Note (Signed)
PATIENT:  Terry Rogers  PRE-OPERATIVE DIAGNOSIS:  Adenocarcinoma of the prostate  POST-OPERATIVE DIAGNOSIS:  Same  PROCEDURE:  Procedure(s): 1. I-125 radioactive seed implantation 2. Cystoscopy  SURGEON:  Surgeon(s): Irine Seal MD  Radiation oncologist: Dr. Tyler Pita  ANESTHESIA:  General  EBL:  Minimal  DRAINS: 43 French Foley catheter  INDICATION: [name] is a 77 y.o. with Stage T1c, Gleason 7(3+4) prostate cancer who has elected brachytherapy for treatment.  Description of procedure: After informed consent the patient was brought to the major OR, placed on the table and administered general anesthesia. He was then moved to the modified lithotomy position with his perineum perpendicular to the floor. His perineum and genitalia were then sterilely prepped. An official timeout was then performed. A 16 French Foley catheter was then placed in the bladder and filled with dilute contrast, a rectal tube was placed in the rectum and the transrectal ultrasound probe was placed in the rectum and affixed to the stand. He was then sterilely draped.  The sterile grid was installed.   Anchor needles were then placed.   Real time ultrasonography was used along with the seed planning software spot-pro version 3.1-00. This was used to develop the seed plan including the number of needles as well as number of seeds required for complete and adequate coverage. Real-time ultrasonography was then used along with the previously developed plan and the Nucletron device to implant a total of 66 seeds using 19 needles for a target dose of 145 Gy. This proceeded without difficulty or complication.  The US guide was removed and the spaceOAR needle was advanced under US guidance in to the prerectal fat in the midline to the base of the prostate.  NS was injected to confirm appropriate needle positioning and the SpaceOAR polymer was then injected slowly over 10 secs with excellent distribution.  The  needle was removed followed by the US probe.  The Foley catheter was then removed as well as the transrectal ultrasound probe and rectal probe. Flexible cystoscopy was then performed using the 17 French flexible scope which revealed a normal urethra throughout its length down to the sphincter which appeared intact. The prostatic urethra was 3cm with bilobar hyperplasia with some obstruction. The bladder was then entered and fully and systematically inspected.  The urine was slightly bloody but no active bleeding was identified.  The ureteral orifices were noted to be of normal configuration and position. The mucosa revealed no evidence of tumors. There were also no stones identified within the bladder.   No seeds or spacers were seen and/or removed from the bladder.  The cystoscope was then removed.  The drapes were removed.  The perineum was cleaned and dressed.  He was taken out of the lithotomy position and was awakened and taken to recovery room in stable and satisfactory condition. He tolerated procedure well and there were no intraoperative complications.

## 2017-09-11 NOTE — Anesthesia Procedure Notes (Signed)
Procedure Name: LMA Insertion Date/Time: 09/11/2017 7:48 AM Performed by: Justice Rocher, CRNA Pre-anesthesia Checklist: Patient identified, Emergency Drugs available, Suction available and Patient being monitored Patient Re-evaluated:Patient Re-evaluated prior to induction Oxygen Delivery Method: Circle system utilized Preoxygenation: Pre-oxygenation with 100% oxygen Induction Type: IV induction Ventilation: Mask ventilation without difficulty LMA: LMA inserted LMA Size: 5.0 Number of attempts: 1 Airway Equipment and Method: Bite block Placement Confirmation: positive ETCO2 and breath sounds checked- equal and bilateral Tube secured with: Tape Dental Injury: Teeth and Oropharynx as per pre-operative assessment

## 2017-09-11 NOTE — Transfer of Care (Signed)
Immediate Anesthesia Transfer of Care Note  Patient: Terry Rogers  Procedure(s) Performed: Procedure(s) (LRB): RADIOACTIVE SEED IMPLANT/BRACHYTHERAPY IMPLANT (N/A) SPACE OAR INSTILLATION (N/A) CYSTOSCOPY  Patient Location: PACU  Anesthesia Type: General  Level of Consciousness: awake, sedated, patient cooperative and responds to stimulation  Airway & Oxygen Therapy: Patient Spontanous Breathing and Patient connected to [place] O2  Post-op Assessment: Report given to PACU RN, Post -op Vital signs reviewed and stable and Patient moving all extremities  Post vital signs: Reviewed and stable  Complications: No apparent anesthesia complications

## 2017-09-11 NOTE — Interval H&P Note (Signed)
History and Physical Interval Note:  09/11/2017 7:16 AM  Terry Rogers  has presented today for surgery, with the diagnosis of PROSTATE CANCER  The various methods of treatment have been discussed with the patient and family. After consideration of risks, benefits and other options for treatment, the patient has consented to  Procedure(s): RADIOACTIVE SEED IMPLANT/BRACHYTHERAPY IMPLANT (N/A) SPACE OAR INSTILLATION (N/A) as a surgical intervention .  The patient's history has been reviewed, patient examined, no change in status, stable for surgery.  I have reviewed the patient's chart and labs.  Questions were answered to the patient's satisfaction.     Irine Seal

## 2017-09-11 NOTE — Discharge Instructions (Addendum)
No advil , aleve, motrin, ibuprofen until 3 pm today  Brachytherapy for Prostate Cancer, Care After Refer to this sheet in the next few weeks. These instructions provide you with information on caring for yourself after your procedure. Your health care provider may also give you more specific instructions. Your treatment has been planned according to current medical practices, but problems sometimes occur. Call your health care provider if you have any problems or questions after your procedure. What can I expect after the procedure? The area behind the scrotum will probably be tender and bruised. For a short period of time you may have:  Difficulty passing urine. You may need a catheter for a few days to a month.  Blood in the urine or semen.  A feeling of constipation because of prostate swelling.  Frequent feeling of an urgent need to urinate.  For a long period of time you may have:  Inflammation of the rectum. This happens in about 2% of people who have the procedure.  Erection problems. These vary with age and occur in about 15-40% of men.  Difficulty urinating. This is caused by scarring in the urethra.  Diarrhea.  Follow these instructions at home:  Take medicines only as directed by your health care provider.  You will probably have a catheter in your bladder for several days. You will have blood in the urine bag and should drink a lot of fluids to keep it a light red color.  Keep all follow-up visits as directed by your health care provider. If you have a catheter, it will be removed during one of these visits.  Try not to sit directly on the area behind the scrotum. A soft cushion can decrease the discomfort. Ice packs may also be helpful for the discomfort. Do not put ice directly on the skin.  Shower and wash the area behind the scrotum gently. Do not sit in a tub.  If you have had the brachytherapy that uses the seeds, limit your close contact with children and  pregnant women for 2 months because of the radiation still in the prostate. After that period of time, the levels drop off quickly. Get help right away if:  You have a fever.  You have chills.  You have shortness of breath.  You have chest pain.  You have thick blood, like tomato juice, in the urine bag.  Your catheter is blocked so urine cannot get into the bag. Your bladder area or lower abdomen may be swollen.  There is excessive bleeding from your rectum. It is normal to have a little blood mixed with your stool.  There is severe discomfort in the treated area that does not go away with pain medicine.  You have abdominal discomfort.  You have severe nausea or vomiting.  You develop any new or unusual symptoms. This information is not intended to replace advice given to you by your health care provider. Make sure you discuss any questions you have with your health care provider. Document Released: 07/27/2010 Document Revised: 12/06/2015 Document Reviewed: 12/15/2012 Elsevier Interactive Patient Education  2017 [place] [facility] Care Instructions  Activity: Get plenty of rest for the remainder of the day. A responsible individual must stay with you for 24 hours following the procedure.  For the next 24 hours, DO NOT: -Drive a car -Paediatric nurse -Drink alcoholic beverages -Take any medication unless instructed by your physician -Make any legal decisions or sign important papers.  Meals: Start with  liquid foods such as gelatin or soup. Progress to regular foods as tolerated. Avoid greasy, spicy, heavy foods. If nausea and/or vomiting occur, drink only clear liquids until the nausea and/or vomiting subsides. Call your physician if vomiting continues.  Special Instructions/Symptoms: Your throat may feel dry or sore from the anesthesia or the breathing tube placed in your throat during surgery. If this causes discomfort, gargle with warm salt water. The  discomfort should disappear within 24 hours.  If you had a scopolamine patch placed behind your ear for the management of post- operative nausea and/or vomiting:  1. The medication in the patch is effective for 72 hours, after which it should be removed.  Wrap patch in a tissue and discard in the trash. Wash hands thoroughly with soap and water. 2. You may remove the patch earlier than 72 hours if you experience unpleasant side effects which may include dry mouth, dizziness or visual disturbances. 3. Avoid touching the patch. Wash your hands with soap and water after contact with the patch.

## 2017-09-11 NOTE — Anesthesia Postprocedure Evaluation (Signed)
Anesthesia Post Note  Patient: Terry Rogers  Procedure(s) Performed: RADIOACTIVE SEED IMPLANT/BRACHYTHERAPY IMPLANT (N/A ) SPACE OAR INSTILLATION (N/A ) CYSTOSCOPY     Patient location during evaluation: PACU Anesthesia Type: General Level of consciousness: awake and alert Pain management: pain level controlled Vital Signs Assessment: post-procedure vital signs reviewed and stable Respiratory status: spontaneous breathing, nonlabored ventilation, respiratory function stable and patient connected to nasal cannula oxygen Cardiovascular status: blood pressure returned to baseline and stable Postop Assessment: no apparent nausea or vomiting Anesthetic complications: no    Last Vitals:  Vitals:   09/11/17 0945 09/11/17 1000  BP: 112/74 116/74  Pulse: (!) 56 (!) 57  Resp: (!) 9 13  Temp:    SpO2: 95% 97%    Last Pain:  Vitals:   09/11/17 0913  TempSrc:   PainSc: Asleep                 Maximo Spratling S

## 2017-09-12 ENCOUNTER — Encounter (HOSPITAL_BASED_OUTPATIENT_CLINIC_OR_DEPARTMENT_OTHER): Payer: Self-pay | Admitting: Urology

## 2017-09-14 NOTE — Progress Notes (Signed)
  Radiation Oncology         [phone]) [phone] ________________________________  Name: SHEM PLEMMONS MRN: [id number]  Date: 09/14/2017  DOB: 02-Jan-1941       Prostate Seed Implant  WJ:[id number], [name], DO  No ref. provider found  DIAGNOSIS:  77 y.o. gentleman with Stage T1c adenocarcinoma of the prostate with Gleason Score of 3+4, and PSA of 4.28    ICD-10-CM   1. Prostate cancer ([place]) C61 DG Chest 2 View    DG Chest 2 View    PROCEDURE: Insertion of radioactive I-125 seeds into the prostate gland.  RADIATION DOSE: 145 Gy, definitive therapy.  TECHNIQUE: Chaze Hruska Strauch was brought to the operating room with the urologist. He was placed in the dorsolithotomy position. He was catheterized and a rectal tube was inserted. The perineum was shaved, prepped and draped. The ultrasound probe was then introduced into the rectum to see the prostate gland.  TREATMENT DEVICE: A needle grid was attached to the ultrasound probe stand and anchor needles were placed.  3D PLANNING: The prostate was imaged in 3D using a sagittal sweep of the prostate probe. These images were transferred to the planning computer. There, the prostate, urethra and rectum were defined on each axial reconstructed image. Then, the software created an optimized 3D plan and a few seed positions were adjusted. The quality of the plan was reviewed using [facility] information for the target and the following two organs at risk:  Urethra and Rectum.  Then the accepted plan was uploaded to the seed Selectron afterloading unit.  PROSTATE VOLUME STUDY:  Using transrectal ultrasound the volume of the prostate was verified to be 40.9 cc.  SPECIAL TREATMENT PROCEDURE/SUPERVISION AND HANDLING: The Nucletron FIRST system was used to place the needles under sagittal guidance. A total of 19 needles were used to deposit 66 seeds in the prostate gland. The individual seed activity was 0.504 mCi.  SpaceOAR:  Yes  COMPLEX SIMULATION: At the end of the  procedure, an anterior radiograph of the pelvis was obtained to document seed positioning and count. Cystoscopy was performed to check the urethra and bladder.  MICRODOSIMETRY: At the end of the procedure, the patient was emitting 0.065 mR/hr at 1 meter. Accordingly, he was considered safe for hospital discharge.  PLAN: The patient will return to the radiation oncology clinic for post implant CT dosimetry in three weeks.   ________________________________  Sheral Apley Tammi Klippel, M.D.

## 2017-10-01 ENCOUNTER — Telehealth: Payer: Self-pay | Admitting: *Deleted

## 2017-10-01 NOTE — Telephone Encounter (Signed)
Called patient to remind of MRI for 10-02-17- arrival time - 8:45 am @ [facility], no restrictions to test, and patient to have post op appts for 10-02-17, lvm for a return call

## 2017-10-02 ENCOUNTER — Encounter: Payer: Self-pay | Admitting: Radiation Oncology

## 2017-10-02 ENCOUNTER — Ambulatory Visit (HOSPITAL_COMMUNITY)
Admission: RE | Admit: 2017-10-02 | Discharge: 2017-10-02 | Disposition: A | Discharge status: Home / Self Care | Payer: Medicare HMO | Source: Ambulatory Visit | Attending: Urology | Admitting: Urology

## 2017-10-02 ENCOUNTER — Other Ambulatory Visit: Payer: Self-pay

## 2017-10-02 ENCOUNTER — Ambulatory Visit
Admission: RE | Admit: 2017-10-02 | Discharge: 2017-10-02 | Disposition: A | Discharge status: Home / Self Care | Payer: Medicare HMO | Source: Ambulatory Visit | Attending: Radiation Oncology | Admitting: Radiation Oncology

## 2017-10-02 VITALS — BP 119/79 | HR 57 | Temp 97.7°F | Resp 16 | Wt 223.0 lb

## 2017-10-02 DIAGNOSIS — Z79899 Other long term (current) drug therapy: Secondary | ICD-10-CM | POA: Diagnosis not present

## 2017-10-02 DIAGNOSIS — R300 Dysuria: Secondary | ICD-10-CM | POA: Diagnosis not present

## 2017-10-02 DIAGNOSIS — Z510 Encounter for antineoplastic radiation therapy: Secondary | ICD-10-CM | POA: Diagnosis not present

## 2017-10-02 DIAGNOSIS — C61 Malignant neoplasm of prostate: Secondary | ICD-10-CM | POA: Insufficient documentation

## 2017-10-02 DIAGNOSIS — R3911 Hesitancy of micturition: Secondary | ICD-10-CM | POA: Diagnosis not present

## 2017-10-02 DIAGNOSIS — Z923 Personal history of irradiation: Secondary | ICD-10-CM | POA: Diagnosis not present

## 2017-10-02 NOTE — Progress Notes (Signed)
  Radiation Oncology         [phone] ________________________________  Name: Terry Rogers MRN: [id number]  Date: 10/02/2017  DOB: 1940-07-09  COMPLEX SIMULATION NOTE  NARRATIVE:  The patient was brought to the [facility] today following prostate seed implantation approximately one month ago.  Identity was confirmed.  All relevant records and images related to the planned course of therapy were reviewed.  Then, the patient was set-up supine.  CT images were obtained.  The CT images were loaded into the planning software.  Then the prostate and rectum were contoured.  Treatment planning then occurred.  The implanted iodine 125 seeds were identified by the physics staff for projection of radiation distribution  I have requested : 3D Simulation  I have requested a DVH of the following structures: Prostate and rectum.    ________________________________  Sheral Apley Tammi Klippel, M.D.  This document serves as a record of services personally performed by Terry Pita, MD. It was created on his behalf by Bethann Humble, a trained medical scribe. The creation of this record is based on the scribe's personal observations and the provider's statements to them. This document has been checked and approved by the attending provider.

## 2017-10-02 NOTE — Progress Notes (Signed)
Radiation Oncology         [phone]) [phone] ________________________________  Name: Terry Rogers MRN: [id number]  Date: 10/02/2017  DOB: July 06, 1941  Follow-Up Visit Note  CC: Virgel Gess, MD  Diagnosis: 77 y.o. gentleman with Stage T1cadenocarcinoma of the prostate with Gleason Score of 3+4, and PSA of4.28    ICD-10-CM   1. Malignant neoplasm of prostate ([address]) C61     Interval Since Last Radiation: 3 weeks 09/11/17: 145 Gy to the prostate with radioactive seed implant I-125 and SpaceOAR gel  Narrative:  The patient returns today for routine follow-up.  He is complaining of increased urinary frequency and urinary hesitation symptoms. He describes dysuria at the beginning of his stream.  He was evaluated with Dr. Jeffie Pollock this morning and started on Flomax to help improve his stream.  He specifically denies gross hematuria, fever or chills.  He filled out a questionnaire regarding urinary function today providing and overall IPSS score of 13 characterizing his symptoms as moderate.  His pre-implant score was 2. He denies abdominal pain, nausea, vomiting or diarrhea.  He reports a healthy appetite and is maintaining his weight.   ALLERGIES:  is allergic to sulfa antibiotics.  Meds: Current Outpatient Medications  Medication Sig Dispense Refill  . atenolol (TENORMIN) 50 MG tablet Take 1 tablet (50 mg total) by mouth daily. (Patient taking differently: Take 50 mg by mouth every morning. ) 90 tablet 1  . bismuth subsalicylate (PEPTO BISMOL) 262 MG/15ML suspension Take 30 mLs by mouth every 6 (six) hours as needed.    . calcium carbonate (TUMS - DOSED IN MG ELEMENTAL CALCIUM) 500 MG chewable tablet Chew 1 tablet by mouth as needed for indigestion or heartburn.    . ezetimibe (ZETIA) 10 MG tablet Take 1 tablet (10 mg total) by mouth daily. (Patient taking differently: Take 10 mg by mouth every morning. ) 90 tablet 0  . fenofibrate (TRICOR) 145 MG tablet Take 1 tablet (145  mg total) by mouth at bedtime. (Patient taking differently: Take 145 mg by mouth every morning. ) 90 tablet 1  . hydrocortisone (ANUSOL-HC) 25 MG suppository PLACE 1 SUPPOSITORY (25 MG TOTAL) RECTALLY 2 (TWO) TIMES DAILY. (Patient taking differently: PLACE 1 SUPPOSITORY (25 MG TOTAL) RECTALLY 2 (TWO) TIMES DAILY.  as needed) 12 suppository 0  . Multiple Vitamins-Minerals (CENTRUM SILVER ULTRA MENS PO) Take by mouth daily.     . naproxen sodium (ANAPROX) 220 MG tablet Take 220 mg by mouth every morning.     . nicotine polacrilex (NICORETTE) 4 MG gum Take by mouth as needed for smoking cessation.    Marland Kitchen nystatin (NYSTATIN) powder Apply topically 3 (three) times daily. To groin rash 30 g 0  . Omega-3 Fatty Acids (FISH OIL) 1200 MG CAPS Take 1 capsule by mouth daily.    Marland Kitchen rOPINIRole (REQUIP XL) 2 MG 24 hr tablet Take 2 mg at bedtime by mouth.    Marland Kitchen rOPINIRole (REQUIP) 1 MG tablet 1 tablet by mouth 5 times daily prn 60 tablet 0  . sildenafil (VIAGRA) 100 MG tablet Take 100 mg by mouth daily as needed for erectile dysfunction.    . traMADol (ULTRAM) 50 MG tablet Take 1 tablet (50 mg total) by mouth every 6 (six) hours as needed. 8 tablet 0  . triamcinolone cream (KENALOG) 0.1 % APPLY 1 APPLICATION        TOPICALLY TWO TIMES A DAY (Patient taking differently: 2 (two) times daily as needed. APPLY 1  APPLICATION        TOPICALLY TWO TIMES A DAY) 454 g 3   No current facility-administered medications for this encounter.     Physical Findings: The patient is in no acute distress. Patient is alert and oriented.  weight is 223 lb (101.2 kg). His oral temperature is 97.7 F (36.5 C). His blood pressure is 119/79 and his pulse is 57 (abnormal). His respiration is 16 and oxygen saturation is 97%. .  No significant changes. In general this is a well appearing caucasian gentleman in no acute distress. He's alert and oriented x4 and appropriate throughout the examination. Cardiopulmonary assessment is negative for acute  distress and he exhibits normal effort.    Lab Findings: Lab Results  Component Value Date   WBC 5.4 09/08/2017   HGB 16.4 09/08/2017   HCT 50.0 09/08/2017   MCV 86.4 09/08/2017   PLT 136 (L) 09/08/2017    Radiographic Findings:  Patient underwent CT imaging in our clinic for post implant dosimetry. The CT scan was personally reviewed by Dr. Tammi Klippel and appears to demonstrate an adequate distribution of radioactive seeds throughout the prostate gland. There are no seeds in or near the rectum.  He had an MRI of the prostate this morning and these images will be fused with his CT scan for further evaluation.  We suspect the final radiation plan and dosimetry will show appropriate coverage of the prostate gland.   Impression: The patient is recovering from the effects of radiation. His urinary symptoms should gradually improve over the next 4-6 months. We talked about this today. He is encouraged by his improvement already and is otherwise pleased with his outcome.   Plan: Today, we spent time talking to the patient about his prostate seed implant and resolving urinary symptoms. He will start Flomax today to help alleviate some of his persistent BOO sxs.  We also talked about long-term follow-up for prostate cancer following seed implant.  He has a follow-up appointment with Dr. Jeffie Pollock in June 2019.  He understands that ongoing PSA determinations and digital rectal exams will help perform surveillance to rule out disease recurrence. He understands what to expect with his PSA measures. Patient was also educated today about some of the long-term effects from radiation including a small risk for rectal bleeding and possibly erectile dysfunction. We talked about some of the general management approaches to these potential complications. However, I did encourage the patient to contact our office or return at any point if he has questions or concerns related to his previous radiation and prostate  cancer.  _____________________________________   Nicholos Johns, PA-C    Tyler Pita, MD  [place]: [phone]  Fax: [phone] [email or web address]  Skype  [email or web address]  This document serves as a record of services personally performed by Tyler Pita, MD and Freeman Caldron, PA-C. It was created on their behalf by Bethann Humble, a trained medical scribe. The creation of this record is based on the scribe's personal observations and the provider's statements to them. This document has been checked and approved by the attending provider.

## 2017-10-02 NOTE — Progress Notes (Signed)
Weight and vitals stable. Denies pain. MRI to confirm SpaceOar placement was done today at 0900. Pre seed IPSS 2. Post seed IPSS 13. Seen by urologist this morning and prescribed flomax. Scheduled to follow up with urologist again in three months. Reports dysuria. Denies hematuria, leakage or incontinence.   BP 119/79 (BP Location: Right Arm, Patient Position: Sitting, Cuff Size: Normal)   Pulse (!) 57   Temp 97.7 F (36.5 C) (Oral)   Resp 16   Wt 223 lb (101.2 kg)   SpO2 97%   BMI 31.10 kg/m  Wt Readings from Last 3 Encounters:  10/02/17 223 lb (101.2 kg)  09/11/17 227 lb 8 oz (103.2 kg)  05/15/17 233 lb 8 oz (105.9 kg)

## 2017-10-20 ENCOUNTER — Encounter: Payer: Self-pay | Admitting: Radiation Oncology

## 2017-10-20 ENCOUNTER — Ambulatory Visit: Payer: Medicare HMO | Attending: Radiation Oncology | Admitting: Radiation Oncology

## 2017-10-20 DIAGNOSIS — C61 Malignant neoplasm of prostate: Secondary | ICD-10-CM | POA: Insufficient documentation

## 2017-10-20 DIAGNOSIS — Z510 Encounter for antineoplastic radiation therapy: Secondary | ICD-10-CM | POA: Insufficient documentation

## 2017-10-21 ENCOUNTER — Encounter: Payer: Self-pay | Admitting: Family Medicine

## 2017-10-22 ENCOUNTER — Other Ambulatory Visit: Payer: Self-pay | Admitting: Family Medicine

## 2017-10-22 DIAGNOSIS — G2581 Restless legs syndrome: Secondary | ICD-10-CM

## 2017-10-22 NOTE — Telephone Encounter (Signed)
We have not prescribed these medications for the patient previously.  Please review and refill if appropriate.  T. Nelson, CMA  

## 2017-10-26 NOTE — Telephone Encounter (Signed)
Needs ov to address these chronic issues and document how txmnt plan is going etc.  Last seen for CPE in Nov 2018- hasn't followed up since.

## 2017-10-26 NOTE — Progress Notes (Signed)
  Radiation Oncology         [phone]) [phone] ________________________________  Name: Terry Rogers MRN: [id number]  Date: 10/20/2017  DOB: 11-01-1940  3D Planning Note   Prostate Brachytherapy Post-Implant Dosimetry  Diagnosis: 77 y.o. gentleman with Stage T1c adenocarcinoma of the prostate with Gleason Score of 3+4, and PSA of 4.28  Narrative: On a previous date, Terry Rogers returned following prostate seed implantation for post implant planning. He underwent CT scan complex simulation to delineate the three-dimensional structures of the pelvis and demonstrate the radiation distribution.  Since that time, the seed localization, and complex isodose planning with dose volume histograms have now been completed.  Results:   Prostate Coverage - The dose of radiation delivered to the 90% or more of the prostate gland (D90) was 122.03% of the prescription dose. This exceeds our goal of greater than 90%. Rectal Sparing - The volume of rectal tissue receiving the prescription dose or higher was 0.0 cc. This falls under our thresholds tolerance of 1.0 cc.  Impression: The prostate seed implant appears to show adequate target coverage and appropriate rectal sparing.  Plan:  The patient will continue to follow with urology for ongoing PSA determinations. I would anticipate a high likelihood for local tumor control with minimal risk for rectal morbidity.  ________________________________  Sheral Apley Tammi Klippel, M.D.

## 2017-10-27 ENCOUNTER — Encounter: Payer: Self-pay | Admitting: Family Medicine

## 2017-10-27 ENCOUNTER — Ambulatory Visit (INDEPENDENT_AMBULATORY_CARE_PROVIDER_SITE_OTHER): Payer: Medicare HMO | Admitting: Family Medicine

## 2017-10-27 VITALS — BP 119/72 | HR 72 | Temp 98.4°F | Resp 20 | Ht 71.0 in | Wt 236.0 lb

## 2017-10-27 DIAGNOSIS — E781 Pure hyperglyceridemia: Secondary | ICD-10-CM | POA: Diagnosis not present

## 2017-10-27 DIAGNOSIS — I10 Essential (primary) hypertension: Secondary | ICD-10-CM

## 2017-10-27 DIAGNOSIS — R7989 Other specified abnormal findings of blood chemistry: Secondary | ICD-10-CM | POA: Diagnosis not present

## 2017-10-27 DIAGNOSIS — R7303 Prediabetes: Secondary | ICD-10-CM

## 2017-10-27 DIAGNOSIS — G2581 Restless legs syndrome: Secondary | ICD-10-CM

## 2017-10-27 DIAGNOSIS — E782 Mixed hyperlipidemia: Secondary | ICD-10-CM

## 2017-10-27 LAB — HEMOGLOBIN A1C: A1C: 6.1

## 2017-10-27 MED ORDER — ROPINIROLE HCL 1 MG PO TABS: ORAL_TABLET | ORAL | 5 refills | Status: DC

## 2017-10-27 MED ORDER — NYSTATIN 100000 UNIT/GM EX POWD: Freq: Three times a day (TID) | CUTANEOUS | 0 refills | Status: AC

## 2017-10-27 MED ORDER — FENOFIBRATE 145 MG PO TABS
145.0000 mg | ORAL_TABLET | Freq: Every day | ORAL | 1 refills | Status: AC
Start: 2017-10-27 — End: ?

## 2017-10-27 MED ORDER — ATENOLOL 50 MG PO TABS: 50.0000 mg | ORAL_TABLET | Freq: Every morning | ORAL | 1 refills | Status: AC

## 2017-10-27 MED ORDER — ROPINIROLE HCL ER 2 MG PO TB24: 2.0000 mg | ORAL_TABLET | Freq: Every day | ORAL | 1 refills | Status: AC

## 2017-10-27 MED ORDER — EZETIMIBE 10 MG PO TABS: 10.0000 mg | ORAL_TABLET | Freq: Every day | ORAL | 1 refills | Status: AC

## 2017-10-27 NOTE — Telephone Encounter (Signed)
See office visit note from today.  Thanks as these were RFed.

## 2017-10-27 NOTE — Telephone Encounter (Signed)
LVM requesting call back from pt.  Terry Rogers, CMA

## 2017-10-27 NOTE — Telephone Encounter (Signed)
Pt seen in office 10/27/17.  [name], CMA

## 2017-10-27 NOTE — Progress Notes (Signed)
Impression and Recommendations:    1. Restless leg syndrome   2. Essential hypertension   3. Hyperlipidemia, mixed   4. Hyperlipidemia type III   5. Hypertriglyceridemia   6. Prediabetes   7. Elevated serum creatinine     1. Restless legs- -pt takes 2mg  qhs and 1 mg 5-6 times per day. -continue meds. -refill given.  2. Essential HTN- -BP well-controlled and at goal. Pt is asymptomatic and stable.  -Continue meds. Check your BP and keep a log. Bring this into next OV.   3. HLD/HyperTG -refill meds. Tolerating well. Sx stable. -Labs UTD. -continue meds. -reduce intake of saturated, trans fats, and fatty carbohydrates.  4. Prediabetes- Last checked a1c 05-15-17 where it was 6.1. -recheck a1c today. -continue prudent diet and exercise.  5. Elevated serum creatinine -Told pt to cut back on aleve/ibuprofen etc.  -from recent hospitalization, serum creatinine was 1.28. -check CMP today.  -refill meds.   Orders Placed This Encounter  Procedures  . Basic metabolic panel  . Hemoglobin A1c    Meds ordered this encounter  Medications  . atenolol (TENORMIN) 50 MG tablet    Sig: Take 1 tablet (50 mg total) by mouth every morning.    Dispense:  90 tablet    Refill:  1  . ezetimibe (ZETIA) 10 MG tablet    Sig: Take 1 tablet (10 mg total) by mouth daily.    Dispense:  90 tablet    Refill:  1  . fenofibrate (TRICOR) 145 MG tablet    Sig: Take 1 tablet (145 mg total) by mouth at bedtime.    Dispense:  90 tablet    Refill:  1  . nystatin (NYSTATIN) powder    Sig: Apply topically 3 (three) times daily. To groin rash    Dispense:  30 g    Refill:  0  . rOPINIRole (REQUIP XL) 2 MG 24 hr tablet    Sig: Take 1 tablet (2 mg total) by mouth at bedtime.    Dispense:  90 tablet    Refill:  1  . rOPINIRole (REQUIP) 1 MG tablet    Sig: 1 tablet by mouth 5 times daily prn    Dispense:  150 tablet    Refill:  5    Gross side effects, risk and benefits, and  alternatives of medications and treatment plan in general discussed with patient.  Patient is aware that all medications have potential side effects and we are unable to predict every side effect or drug-drug interaction that may occur.   Patient will call with any questions prior to using medication if they have concerns.  Expresses verbal understanding and consents to current therapy and treatment regimen.  No barriers to understanding were identified.  Red flag symptoms and signs discussed in detail.  Patient expressed understanding regarding what to do in case of emergency\urgent symptoms  Please see AVS handed out to patient at the end of our visit for further patient instructions/ counseling done pertaining to today's office visit.   Return for 4-64-month follow-up for chronic care.  This is in addition to any yearly Medicare wellness.    Note: This note was prepared with assistance of Dragon voice recognition software. Occasional wrong-word or sound-a-like substitutions may have occurred due to the inherent limitations of voice recognition software.   This document serves as a record of services personally performed by Mellody Dance, DO. It was created on her behalf by Mayer Masker, a trained  medical scribe. The creation of this record is based on the scribe's personal observations and the provider's statements to them.   I have reviewed the above medical documentation for accuracy and completeness and I concur.  Mellody Dance 10/27/17 5:10 PM   --------------------------------------------------------------------------------------------------------------------------------------------------------------------------------------------------------------------------------------------    Subjective:     HPI: Terry Rogers is a 77 y.o. male who presents to [place] at [facility] today for issues as discussed below.  He takes aleve daily.   Jock itch He takes  triamcinolone cream for jock itch. This is worse in the summer. He has a h/o inguinal yeast rash. He is not currently taking this medication but always needs it in the summer.   Urologist He reports having a radioactive seed placed in his prostate in February 2019.  He had recent blood work done while he was in the hospital. His serum creatinine was 1.28 on 09-08-17.    Restless legs He takes 2 mg qhs and 1 mg 5-6 times qd. If he does not take it, his legs are "jumpy". He drinks a lot of water and decaffeinated coffee. When he is in [place], he plays golf 3 days a week.   Prediabetes He has been trying to stay away from sweets and pastas. He denies any onset of new symptoms.   Wt Readings from Last 3 Encounters:  10/27/17 236 lb (107 kg)  10/02/17 223 lb (101.2 kg)  09/11/17 227 lb 8 oz (103.2 kg)   BP Readings from Last 3 Encounters:  10/27/17 119/72  10/02/17 119/79  09/11/17 129/74   Pulse Readings from Last 3 Encounters:  10/27/17 72  10/02/17 (!) 57  09/11/17 70   BMI Readings from Last 3 Encounters:  10/27/17 32.92 kg/m  10/02/17 31.10 kg/m  09/11/17 31.73 kg/m     Patient Care Team    Relationship Specialty Notifications Start End  Mellody Dance, DO PCP - General Family Medicine  04/30/16   Pieter Partridge, [place] Physician Neurology  02/29/16    Comment: RLS,   Rana Snare, MD Consulting Physician Urology  02/29/16      Patient Active Problem List   Diagnosis Date Noted  . Hypertriglyceridemia 06/13/2016    Priority: High  . Hyperlipidemia, mixed 03/30/2016    Priority: High  . Hyperlipidemia type III 08/25/2013    Priority: High  . Restless leg syndrome 04/20/2013    Priority: High  . h/o Elevated blood sugar/ Pre-Diabetes 04/20/2013    Priority: High  . Essential hypertension 06/09/2008    Priority: High  . Benign prostatic hyperplasia with nocturia 06/13/2016    Priority: Medium  . Obese- esp abdomen 02/29/2016    Priority: Medium  .  GERD 04/10/2007    Priority: Medium  . Polycythemia, secondary to T injections  03/30/2016    Priority: Low  . Chronic leukopenia 03/29/2016    Priority: Low  . Hypogonadism in male / Low T 02/29/2016    Priority: Low  . h/o Iron deficiency anemia 02/29/2016    Priority: Low  . History of tobacco use disorder- >er 60 pk yr hx 02/29/2016    Priority: Low  . Sleep difficulties 09/16/2011    Priority: Low  . Osteoarthritis 06/09/2008    Priority: Low  . ERECTILE DYSFUNCTION, MILD 05/14/2007    Priority: Low  . Prediabetes 10/27/2017  . Elevated serum creatinine 10/27/2017  . Malignant neoplasm of prostate ([place]) 04/23/2017  . Heat rash 12/26/2016  . Chronic increased urinary frequency  06/13/2016  . Snoring 10/22/2011  . SPONDYLITIS, ANKYLOSING 05/14/2007  . ALLERGIC RHINITIS 04/10/2007    Past Medical history, Surgical history, Family history, Social history, Allergies and Medications have been entered into the medical record, reviewed and changed as needed.    Current Meds  Medication Sig  . atenolol (TENORMIN) 50 MG tablet Take 1 tablet (50 mg total) by mouth every morning.  . bismuth subsalicylate (PEPTO BISMOL) 262 MG/15ML suspension Take 30 mLs by mouth every 6 (six) hours as needed.  . calcium carbonate (TUMS - DOSED IN MG ELEMENTAL CALCIUM) 500 MG chewable tablet Chew 1 tablet by mouth as needed for indigestion or heartburn.  . ezetimibe (ZETIA) 10 MG tablet Take 1 tablet (10 mg total) by mouth daily.  . fenofibrate (TRICOR) 145 MG tablet Take 1 tablet (145 mg total) by mouth at bedtime.  . hydrocortisone (ANUSOL-HC) 25 MG suppository PLACE 1 SUPPOSITORY (25 MG TOTAL) RECTALLY 2 (TWO) TIMES DAILY. (Patient taking differently: PLACE 1 SUPPOSITORY (25 MG TOTAL) RECTALLY 2 (TWO) TIMES DAILY.  as needed)  . Multiple Vitamins-Minerals (CENTRUM SILVER ULTRA MENS PO) Take by mouth daily.   . naproxen sodium (ANAPROX) 220 MG tablet Take 220 mg by mouth every morning.   . nicotine  polacrilex (NICORETTE) 4 MG gum Take by mouth as needed for smoking cessation.  . Omega-3 Fatty Acids (FISH OIL) 1200 MG CAPS Take 1 capsule by mouth daily.  Marland Kitchen rOPINIRole (REQUIP XL) 2 MG 24 hr tablet Take 1 tablet (2 mg total) by mouth at bedtime.  Marland Kitchen rOPINIRole (REQUIP) 1 MG tablet 1 tablet by mouth 5 times daily prn  . sildenafil (VIAGRA) 100 MG tablet Take 100 mg by mouth daily as needed for erectile dysfunction.  . triamcinolone cream (KENALOG) 0.1 % APPLY 1 APPLICATION        TOPICALLY TWO TIMES A DAY (Patient taking differently: 2 (two) times daily as needed. APPLY 1 APPLICATION        TOPICALLY TWO TIMES A DAY)  . [DISCONTINUED] atenolol (TENORMIN) 50 MG tablet Take 1 tablet (50 mg total) by mouth daily. (Patient taking differently: Take 50 mg by mouth every morning. )  . [DISCONTINUED] ezetimibe (ZETIA) 10 MG tablet Take 1 tablet (10 mg total) by mouth daily. (Patient taking differently: Take 10 mg by mouth every morning. )  . [DISCONTINUED] fenofibrate (TRICOR) 145 MG tablet Take 1 tablet (145 mg total) by mouth at bedtime. (Patient taking differently: Take 145 mg by mouth every morning. )  . [DISCONTINUED] rOPINIRole (REQUIP XL) 2 MG 24 hr tablet Take 2 mg at bedtime by mouth.  . [DISCONTINUED] rOPINIRole (REQUIP) 1 MG tablet 1 tablet by mouth 5 times daily prn    Allergies:  Allergies  Allergen Reactions  . Sulfa Antibiotics Other (See Comments)    Unknown childhood allergy     Review of Systems:  A fourteen system review of systems was performed and found to be positive as per HPI.   Objective:   Blood pressure 119/72, pulse 72, temperature 98.4 F (36.9 C), temperature source Oral, resp. rate 20, height 5\' 11"  (1.803 m), weight 236 lb (107 kg), SpO2 96 %. Body mass index is 32.92 kg/m. General:  Well Developed, well nourished, appropriate for stated age.  Neuro:  Alert and oriented,  extra-ocular muscles intact  HEENT:  Normocephalic, atraumatic, neck supple, no carotid  bruits appreciated  Skin:  no gross rash, warm, pink. Cardiac:  RRR, S1 S2 Respiratory:  ECTA B/L and A/P, Not  using accessory muscles, speaking in full sentences- unlabored. Vascular:  Ext warm, no cyanosis apprec.; cap RF less 2 sec. Psych:  No HI/SI, judgement and insight good, Euthymic mood. Full Affect.

## 2017-10-27 NOTE — Patient Instructions (Signed)

## 2017-10-28 LAB — BASIC METABOLIC PANEL
BUN/Creatinine Ratio: 17 (ref 10–24)
BUN: 18 mg/dL (ref 8–27)
CALCIUM: 9.1 mg/dL (ref 8.6–10.2)
CHLORIDE: 107 mmol/L — AB (ref 96–106)
CO2: 24 mmol/L (ref 20–29)
Creatinine, Ser: 1.03 mg/dL (ref 0.76–1.27)
GFR, EST AFRICAN AMERICAN: 82 mL/min/{1.73_m2} (ref >59)
GFR, EST NON AFRICAN AMERICAN: 71 mL/min/{1.73_m2} (ref >59)
Glucose: 69 mg/dL (ref 65–99)
Potassium: 5.2 mmol/L (ref 3.5–5.2)
Sodium: 144 mmol/L (ref 134–144)

## 2017-12-22 ENCOUNTER — Ambulatory Visit: Payer: Medicare HMO | Admitting: Adult Health

## 2017-12-30 DIAGNOSIS — C61 Malignant neoplasm of prostate: Secondary | ICD-10-CM | POA: Diagnosis not present

## 2017-12-31 DIAGNOSIS — R69 Illness, unspecified: Secondary | ICD-10-CM | POA: Diagnosis not present

## 2018-01-14 DIAGNOSIS — N401 Enlarged prostate with lower urinary tract symptoms: Secondary | ICD-10-CM | POA: Diagnosis not present

## 2018-01-14 DIAGNOSIS — R3912 Poor urinary stream: Secondary | ICD-10-CM | POA: Diagnosis not present

## 2018-01-14 DIAGNOSIS — C61 Malignant neoplasm of prostate: Secondary | ICD-10-CM | POA: Diagnosis not present

## 2018-02-13 DIAGNOSIS — D692 Other nonthrombocytopenic purpura: Secondary | ICD-10-CM | POA: Diagnosis not present

## 2018-02-13 DIAGNOSIS — L301 Dyshidrosis [pompholyx]: Secondary | ICD-10-CM | POA: Diagnosis not present

## 2018-02-16 ENCOUNTER — Telehealth: Payer: Self-pay | Admitting: Family Medicine

## 2018-02-16 NOTE — Telephone Encounter (Signed)
Patient came by office to request a ( 90 day supply ) Rx refill of  :  rOPINIRole (REQUIP) 1 MG tablet [[id number]]   Order Details  Dose, Route, Frequency: As Directed   Indications of Use: Restless Leg Syndrome  Dispense Quantity: 150 tablet Refills: 5 Fills remaining: --        Sig: 1 tablet by mouth 5 times daily prn     Please send Rx  To: [place], [place] [phone] (Phone) [phone] (Fax)   --forwarding request to medical assistant.  --glh

## 2018-02-17 ENCOUNTER — Other Ambulatory Visit: Payer: Self-pay

## 2018-02-17 DIAGNOSIS — G2581 Restless legs syndrome: Secondary | ICD-10-CM

## 2018-02-17 MED ORDER — ROPINIROLE HCL 1 MG PO TABS
ORAL_TABLET | ORAL | 5 refills | Status: AC
Start: 2018-02-17 — End: ?

## 2018-02-17 NOTE — Telephone Encounter (Signed)
Patient requesting refill on Requip 1 mg.  Reviewed chart and sent in refills. MPulliam, CMA/RT(R)

## 2018-02-17 NOTE — Telephone Encounter (Signed)
Refills sent in, patient notified. MPulliam, CMA/RT(R)  

## 2018-03-04 ENCOUNTER — Telehealth: Payer: Self-pay

## 2018-03-04 NOTE — Telephone Encounter (Signed)
[facility] requesting refill on Requip. This is a new pharmacy for the patient called to verify with the patient that this is a new pharmacy.  Was unable to reach the patient on the phone, sent a message through [place]. MPulliam, CMA/RT(R)

## 2018-03-10 ENCOUNTER — Other Ambulatory Visit: Payer: Self-pay

## 2018-03-10 NOTE — Progress Notes (Signed)
Received refill request for the patient from [facility] for Ropinirole 1 mg tablet.  Called and verified with the mail order pharmacy that the patient has refills.  Per pharmacy patient has refills and one has been sent to him. MPulliam, CMA/RT(R)

## 2018-03-17 DIAGNOSIS — I10 Essential (primary) hypertension: Secondary | ICD-10-CM | POA: Diagnosis not present

## 2018-03-17 DIAGNOSIS — Z8546 Personal history of malignant neoplasm of prostate: Secondary | ICD-10-CM | POA: Diagnosis not present

## 2018-03-17 DIAGNOSIS — G2581 Restless legs syndrome: Secondary | ICD-10-CM | POA: Diagnosis not present

## 2018-03-17 DIAGNOSIS — J309 Allergic rhinitis, unspecified: Secondary | ICD-10-CM | POA: Diagnosis not present

## 2018-03-17 DIAGNOSIS — Z23 Encounter for immunization: Secondary | ICD-10-CM | POA: Diagnosis not present

## 2018-03-17 DIAGNOSIS — E781 Pure hyperglyceridemia: Secondary | ICD-10-CM | POA: Diagnosis not present

## 2018-03-17 DIAGNOSIS — N529 Male erectile dysfunction, unspecified: Secondary | ICD-10-CM | POA: Diagnosis not present

## 2018-03-17 DIAGNOSIS — Z87891 Personal history of nicotine dependence: Secondary | ICD-10-CM | POA: Diagnosis not present

## 2018-03-19 DIAGNOSIS — J309 Allergic rhinitis, unspecified: Secondary | ICD-10-CM | POA: Diagnosis not present

## 2018-03-26 ENCOUNTER — Ambulatory Visit: Payer: Medicare HMO | Admitting: Family Medicine

## 2019-03-02 ENCOUNTER — Encounter: Payer: Self-pay | Admitting: Family Medicine

## 2019-08-14 IMAGING — CT CT CHEST LUNG CANCER SCREENING LOW DOSE W/O CM
1 of 3 series · 9 of 40 positions shown, 12 images · non-contrast
Comparison: Chest radiograph 03/13/2010.  No prior CT.

CLINICAL DATA: Ex-smoker, quitting 11 years ago. Seventy-five
pack-year history

EXAM:
CT CHEST WITHOUT CONTRAST LOW-DOSE FOR LUNG CANCER SCREENING
TECHNIQUE: Multidetector CT imaging of the chest was performed following the
standard protocol without IV contrast.

[ct lung segmentation data · axial · 0.78mm/px · z∈[+299,+299]mm · 9 of 320 frames shown]
[frame 1/320  mediastinal]
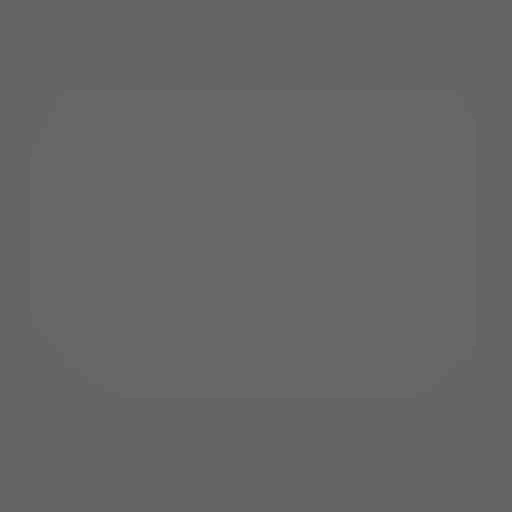
[frame 1/320  lung]
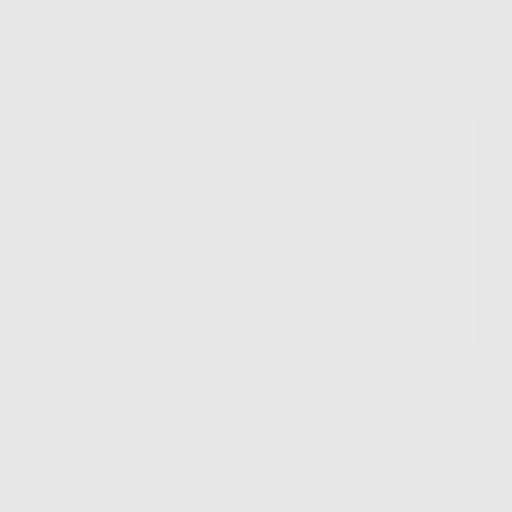
[frame 36/320  lung]
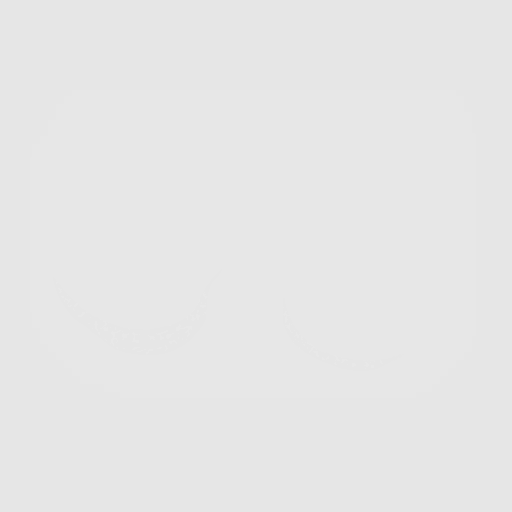
[frame 71/320  lung]
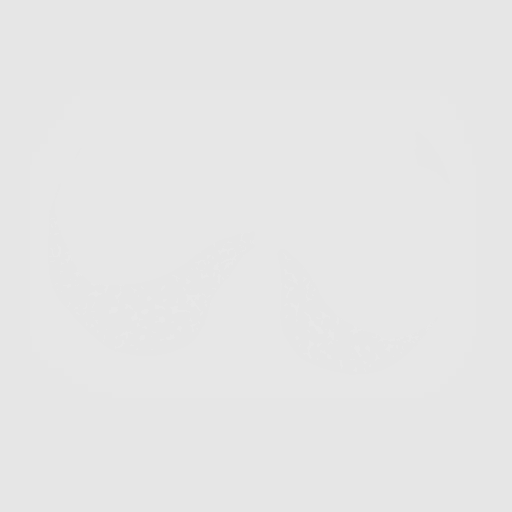
[frame 107/320  lung]
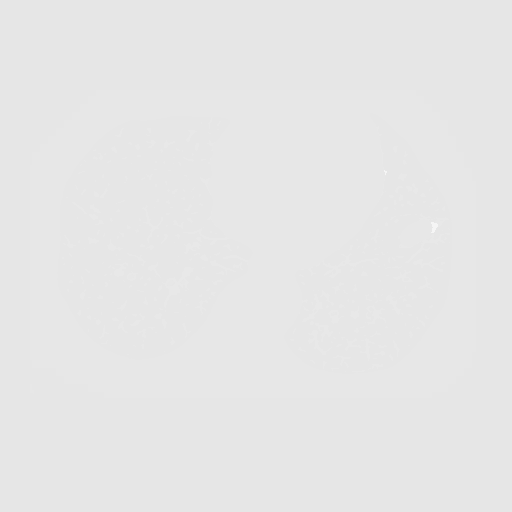
[frame 142/320  mediastinal]
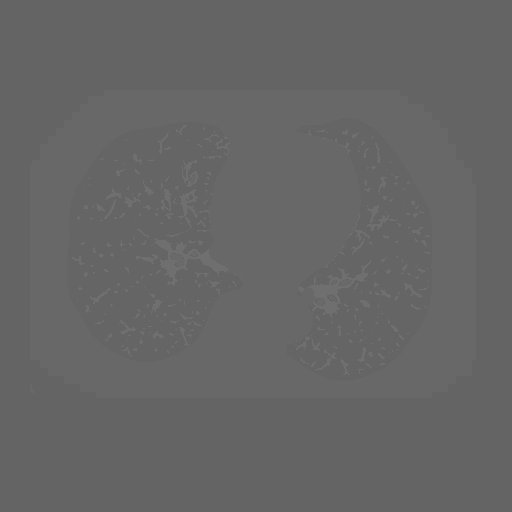
[frame 142/320  lung]
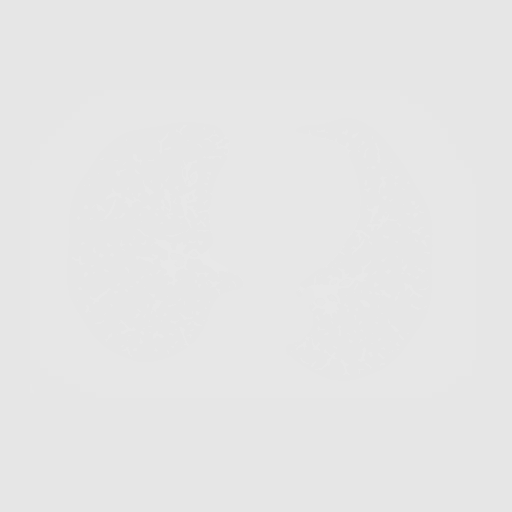
[frame 178/320  lung]
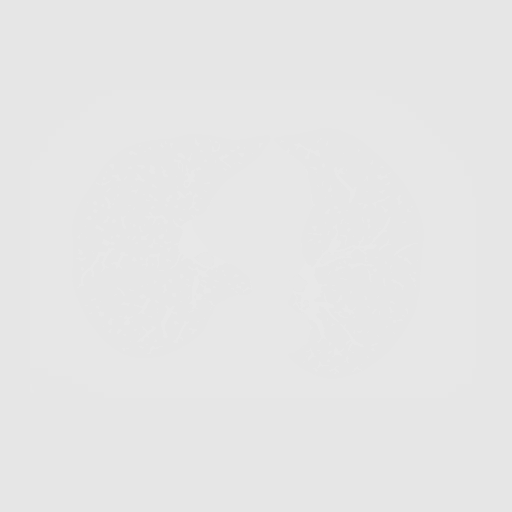
[frame 213/320  lung]
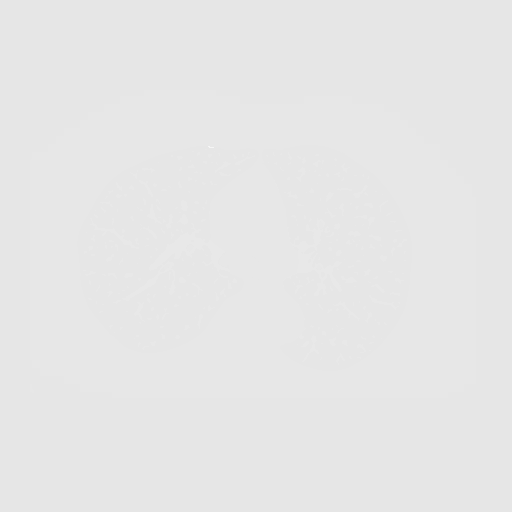
[frame 249/320  lung]
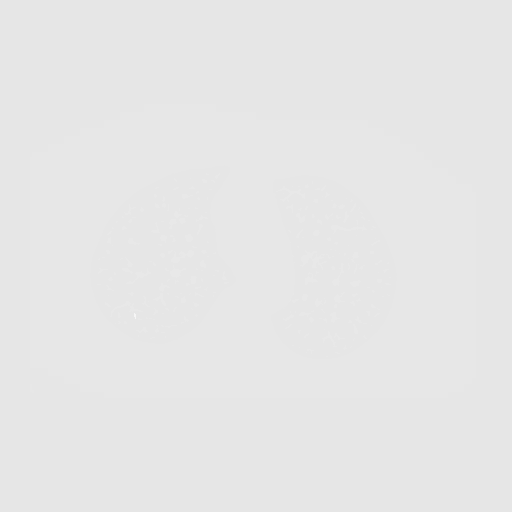
[frame 284/320  mediastinal]
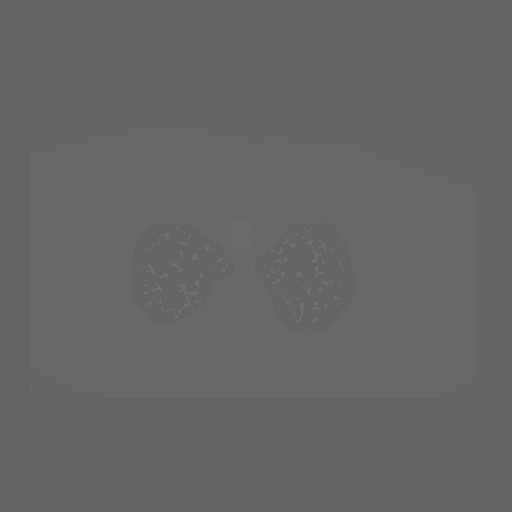
[frame 284/320  lung]
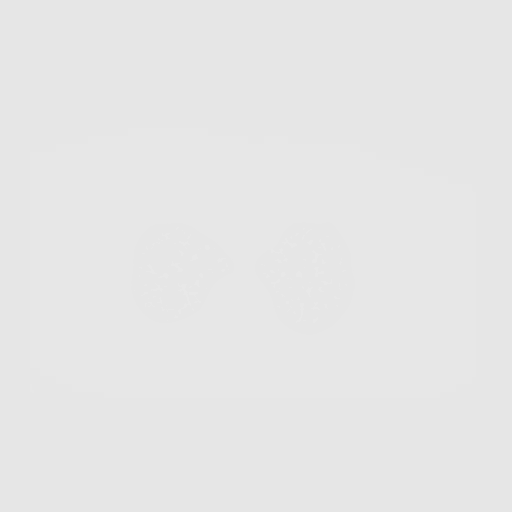

[9 of 40 positions shown; findings below may reference images not displayed]

FINDINGS: Cardiovascular: Tortuous thoracic aorta. Normal heart size, without
pericardial effusion. Lad and left circumflex coronary artery
atherosclerosis.

Mediastinum/Nodes: No mediastinal or definite hilar adenopathy,
given limitations of unenhanced CT.

Lungs/Pleura: No pleural fluid. Bilateral pulmonary nodules. The
larger is in the subpleural right lower lobe at volume derived
equivalent diameter 3.2 mm. Minimal centrilobular emphysema.

Upper Abdomen: Subcapsular high right hepatic lobe subcentimeter
cyst. Mild hepatic steatosis. Normal imaged portions of the spleen,
stomach, pancreas, gallbladder, adrenal glands, left kidney.

Musculoskeletal: No acute osseous abnormality. Mild midthoracic
spondylosis.
IMPRESSION: 1. Lung-RADS 2, benign appearance or behavior. Continue annual
screening with low-dose chest CT without contrast in 12 months.
2.  Emphysema ([id number]).
3.  Multivessel coronary artery atherosclerosis.
4. Mild hepatic steatosis.

## 2021-03-20 ENCOUNTER — Encounter: Payer: Self-pay | Admitting: Internal Medicine
